# Patient Record
Sex: Female | Born: 1965
Health system: Southern US, Community
[De-identification: ages and names within clinical notes are randomized; demographics above are authoritative.]

## PROBLEM LIST (undated history)

## (undated) DIAGNOSIS — F32A Depression, unspecified: Secondary | ICD-10-CM

## (undated) DIAGNOSIS — R4589 Other symptoms and signs involving emotional state: Secondary | ICD-10-CM

## (undated) DIAGNOSIS — F329 Major depressive disorder, single episode, unspecified: Secondary | ICD-10-CM

## (undated) DIAGNOSIS — D496 Neoplasm of unspecified behavior of brain: Secondary | ICD-10-CM

## (undated) DIAGNOSIS — F419 Anxiety disorder, unspecified: Secondary | ICD-10-CM

## (undated) DIAGNOSIS — R4689 Other symptoms and signs involving appearance and behavior: Secondary | ICD-10-CM

## (undated) HISTORY — PX: OTHER SURGICAL HISTORY: SHX169

## (undated) SURGERY — Surgical Case
Anesthesia: *Unknown

---

## 2017-06-01 ENCOUNTER — Other Ambulatory Visit: Payer: Self-pay

## 2017-06-01 ENCOUNTER — Emergency Department (HOSPITAL_COMMUNITY)
Admission: EM | Admit: 2017-06-01 | Discharge: 2017-06-01 | Disposition: A | Discharge status: Home / Self Care | Payer: BLUE CROSS/BLUE SHIELD | Attending: Emergency Medicine | Admitting: Emergency Medicine

## 2017-06-01 ENCOUNTER — Emergency Department (HOSPITAL_COMMUNITY)
Admission: EM | Admit: 2017-06-01 | Discharge: 2017-06-02 | Disposition: A | Discharge status: Other Acute Inpatient Hospital | Payer: BLUE CROSS/BLUE SHIELD | Attending: Emergency Medicine | Admitting: Emergency Medicine

## 2017-06-01 ENCOUNTER — Encounter (HOSPITAL_COMMUNITY): Payer: Self-pay | Admitting: Emergency Medicine

## 2017-06-01 DIAGNOSIS — Z046 Encounter for general psychiatric examination, requested by authority: Secondary | ICD-10-CM | POA: Insufficient documentation

## 2017-06-01 DIAGNOSIS — R45851 Suicidal ideations: Secondary | ICD-10-CM | POA: Insufficient documentation

## 2017-06-01 DIAGNOSIS — F17200 Nicotine dependence, unspecified, uncomplicated: Secondary | ICD-10-CM | POA: Insufficient documentation

## 2017-06-01 DIAGNOSIS — R45850 Homicidal ideations: Secondary | ICD-10-CM | POA: Diagnosis not present

## 2017-06-01 DIAGNOSIS — F329 Major depressive disorder, single episode, unspecified: Secondary | ICD-10-CM | POA: Insufficient documentation

## 2017-06-01 DIAGNOSIS — R51 Headache: Secondary | ICD-10-CM | POA: Diagnosis not present

## 2017-06-01 DIAGNOSIS — Z5321 Procedure and treatment not carried out due to patient leaving prior to being seen by health care provider: Secondary | ICD-10-CM | POA: Insufficient documentation

## 2017-06-01 HISTORY — DX: Depression, unspecified: F32.A

## 2017-06-01 HISTORY — DX: Other symptoms and signs involving emotional state: R45.89

## 2017-06-01 HISTORY — DX: Major depressive disorder, single episode, unspecified: F32.9

## 2017-06-01 HISTORY — DX: Other symptoms and signs involving appearance and behavior: R46.89

## 2017-06-01 HISTORY — DX: Anxiety disorder, unspecified: F41.9

## 2017-06-01 LAB — RAPID URINE DRUG SCREEN, HOSP PERFORMED
Amphetamines: NOT DETECTED
Barbiturates: NOT DETECTED
Benzodiazepines: NOT DETECTED
Cocaine: NOT DETECTED
Opiates: NOT DETECTED
Tetrahydrocannabinol: POSITIVE — AB

## 2017-06-01 LAB — CBC
HCT: 38.9 % (ref 36.0–46.0)
Hemoglobin: 12.4 g/dL (ref 12.0–15.0)
MCH: 26.5 pg (ref 26.0–34.0)
MCHC: 31.9 g/dL (ref 30.0–36.0)
MCV: 83.1 fL (ref 78.0–100.0)
Platelets: 271 10*3/uL (ref 150–400)
RBC: 4.68 MIL/uL (ref 3.87–5.11)
RDW: 14.6 % (ref 11.5–15.5)
WBC: 6.8 10*3/uL (ref 4.0–10.5)

## 2017-06-01 LAB — COMPREHENSIVE METABOLIC PANEL
ALT: 16 U/L (ref 14–54)
AST: 18 U/L (ref 15–41)
Albumin: 3.6 g/dL (ref 3.5–5.0)
Alkaline Phosphatase: 73 U/L (ref 38–126)
Anion gap: 9 (ref 5–15)
BUN: 14 mg/dL (ref 6–20)
CO2: 27 mmol/L (ref 22–32)
Calcium: 9.9 mg/dL (ref 8.9–10.3)
Chloride: 105 mmol/L (ref 101–111)
Creatinine, Ser: 0.94 mg/dL (ref 0.44–1.00)
GFR calc Af Amer: >60 mL/min (ref >60)
GFR calc non Af Amer: >60 mL/min (ref >60)
Glucose, Bld: 89 mg/dL (ref 65–99)
Potassium: 4.2 mmol/L (ref 3.5–5.1)
Sodium: 141 mmol/L (ref 135–145)
Total Bilirubin: 0.5 mg/dL (ref 0.3–1.2)
Total Protein: 6.4 g/dL — ABNORMAL LOW (ref 6.5–8.1)

## 2017-06-01 LAB — SALICYLATE LEVEL: Salicylate Lvl: <7 mg/dL (ref 2.8–30.0)

## 2017-06-01 LAB — ACETAMINOPHEN LEVEL: Acetaminophen (Tylenol), Serum: <10 ug/mL — ABNORMAL LOW (ref 10–30)

## 2017-06-01 LAB — ETHANOL: Alcohol, Ethyl (B): <10 mg/dL (ref <10)

## 2017-06-01 NOTE — ED Provider Notes (Signed)
[facility] EMERGENCY DEPARTMENT Provider Note   CSN: [id number] Arrival date & time: 06/01/17  2219     History   Chief Complaint Chief Complaint  Patient presents with  . Suicidal  . Homicidal    HPI Carolyn Freeman is a 52 y.o. female.  Patient with hx of anxiety, depression, and suicidal behavior presents to the ED with depression.  She states that she has also been feeling like this since Thanksgiving, but she reports that her symptoms have been gradually worsening.  She reports that she has had thought of committing suicide, but has not had a specific plan.  She states that she uses marijuana for chronic headaches.  She states that her last ETOH use was at Christmas.  She states that she has a headache, but that his is her typical patient.  She denies numbness, weakness, tingling, fever, or chills. She denies any other associated symptoms.     The history is provided by the patient. No language interpreter was used.    Past Medical History:  Diagnosis Date  . Anxiety   . Depression   . Suicidal behavior     There are no active problems to display for this patient.   Past Surgical History:  Procedure Laterality Date  . annuresym      OB History    No data available       Home Medications    Prior to Admission medications   Not on File    Family History No family history on file.  Social History Social History   Tobacco Use  . Smoking status: Current Every Day Smoker  . Smokeless tobacco: Current User  Substance Use Topics  . Alcohol use: Yes  . Drug use: Yes    Types: Marijuana     Allergies   Patient has no known allergies.   Review of Systems Review of Systems  All other systems reviewed and are negative.    Physical Exam Updated Vital Signs BP (!) 180/114 (BP Location: Right Arm)   Pulse 60   Temp 98.7 F (37.1 C) (Oral)   Resp 16   Ht 5\' 3"  (1.6 m)   Wt 68 kg (150 lb)   SpO2 100%   BMI 26.57 kg/m    Physical Exam  Constitutional: She is oriented to person, place, and time. She appears well-developed and well-nourished.  HENT:  Head: Normocephalic and atraumatic.  Eyes: Conjunctivae and EOM are normal. Pupils are equal, round, and reactive to light.  Neck: Normal range of motion. Neck supple.  Cardiovascular: Normal rate and regular rhythm. Exam reveals no gallop and no friction rub.  No murmur heard. Pulmonary/Chest: Effort normal and breath sounds normal. No respiratory distress. She has no wheezes. She has no rales. She exhibits no tenderness.  Abdominal: Soft. Bowel sounds are normal. She exhibits no distension and no mass. There is no tenderness. There is no rebound and no guarding.  Musculoskeletal: Normal range of motion. She exhibits no edema or tenderness.  Neurological: She is alert and oriented to person, place, and time.  Skin: Skin is warm and dry.  Psychiatric: She has a normal mood and affect. Her behavior is normal. Judgment and thought content normal.  Nursing note and vitals reviewed.    ED Treatments / Results  Labs (all labs ordered are listed, but only abnormal results are displayed) Labs Reviewed - No data to display  EKG  EKG Interpretation None       Radiology  No results found.  Procedures Procedures (including critical care time)  Medications Ordered in ED Medications - No data to display   Initial Impression / Assessment and Plan / ED Course  I have reviewed the triage vital signs and the nursing notes.  Pertinent labs & imaging results that were available during my care of the patient were reviewed by me and considered in my medical decision making (see chart for details).     Patient with depression and suicidal thoughts.  Eloped from the hospital earlier, but was brought back by [employer].    Medically clear for TTS evaluation.  TTS recommends inpatient treatment.    Final Clinical Impressions(s) / ED Diagnoses   Final diagnoses:   Suicidal ideation    ED Discharge Orders    None       Montine Circle, PA-C 06/02/17 [id number]    Varney Biles, MD 06/02/17 1154

## 2017-06-01 NOTE — ED Triage Notes (Signed)
Pt. Stated, My therapist suggest I come here. Im suicidal, homicidal, and I have depression.  This has been bad for over a month. Pt. Also hears voices.

## 2017-06-01 NOTE — ED Triage Notes (Signed)
Pt brought in by [employer] for IVC after she eloped from the hospital.

## 2017-06-01 NOTE — ED Notes (Signed)
Dr. Laverta Baltimore notified of patient elopement and statements during triage. IVC paperwork initiated.
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
## 2017-06-01 NOTE — ED Notes (Signed)
Pt observed leaving dept in street clothes

## 2017-06-02 ENCOUNTER — Other Ambulatory Visit: Payer: Self-pay

## 2017-06-02 ENCOUNTER — Encounter (HOSPITAL_COMMUNITY): Payer: Self-pay

## 2017-06-02 ENCOUNTER — Inpatient Hospital Stay (HOSPITAL_COMMUNITY)
Admission: AD | Admit: 2017-06-02 | Discharge: 2017-06-04 | DRG: 885 | Disposition: A | Discharge status: Home / Self Care | Payer: BLUE CROSS/BLUE SHIELD | Source: Intra-hospital | Attending: Psychiatry | Admitting: Psychiatry

## 2017-06-02 DIAGNOSIS — Z8249 Family history of ischemic heart disease and other diseases of the circulatory system: Secondary | ICD-10-CM

## 2017-06-02 DIAGNOSIS — I10 Essential (primary) hypertension: Secondary | ICD-10-CM | POA: Diagnosis present

## 2017-06-02 DIAGNOSIS — R51 Headache: Secondary | ICD-10-CM | POA: Diagnosis not present

## 2017-06-02 DIAGNOSIS — Z87891 Personal history of nicotine dependence: Secondary | ICD-10-CM

## 2017-06-02 DIAGNOSIS — F39 Unspecified mood [affective] disorder: Secondary | ICD-10-CM | POA: Diagnosis not present

## 2017-06-02 DIAGNOSIS — Z560 Unemployment, unspecified: Secondary | ICD-10-CM | POA: Diagnosis not present

## 2017-06-02 DIAGNOSIS — G4700 Insomnia, unspecified: Secondary | ICD-10-CM | POA: Diagnosis present

## 2017-06-02 DIAGNOSIS — F6381 Intermittent explosive disorder: Secondary | ICD-10-CM

## 2017-06-02 DIAGNOSIS — F419 Anxiety disorder, unspecified: Secondary | ICD-10-CM | POA: Diagnosis present

## 2017-06-02 DIAGNOSIS — F1210 Cannabis abuse, uncomplicated: Secondary | ICD-10-CM | POA: Diagnosis present

## 2017-06-02 DIAGNOSIS — F329 Major depressive disorder, single episode, unspecified: Secondary | ICD-10-CM | POA: Diagnosis present

## 2017-06-02 DIAGNOSIS — F1290 Cannabis use, unspecified, uncomplicated: Secondary | ICD-10-CM | POA: Diagnosis not present

## 2017-06-02 DIAGNOSIS — F331 Major depressive disorder, recurrent, moderate: Secondary | ICD-10-CM

## 2017-06-02 DIAGNOSIS — F333 Major depressive disorder, recurrent, severe with psychotic symptoms: Secondary | ICD-10-CM | POA: Diagnosis present

## 2017-06-02 DIAGNOSIS — F332 Major depressive disorder, recurrent severe without psychotic features: Secondary | ICD-10-CM | POA: Diagnosis not present

## 2017-06-02 DIAGNOSIS — Z79899 Other long term (current) drug therapy: Secondary | ICD-10-CM

## 2017-06-02 LAB — PREGNANCY, URINE: Preg Test, Ur: NEGATIVE

## 2017-06-02 MED ORDER — LISINOPRIL 10 MG PO TABS
10.0000 mg | ORAL_TABLET | Freq: Once | ORAL | Status: AC
  Administered 2017-06-02: 10 mg via ORAL
  Filled 2017-06-02: qty 1

## 2017-06-02 MED ORDER — LITHIUM CARBONATE 300 MG PO CAPS
300.0000 mg | ORAL_CAPSULE | Freq: Two times a day (BID) | ORAL | Status: DC
  Administered 2017-06-02 – 2017-06-04 (×4): 300 mg via ORAL
  Filled 2017-06-02 (×8): qty 1

## 2017-06-02 MED ORDER — LITHIUM CARBONATE 300 MG PO CAPS
300.0000 mg | ORAL_CAPSULE | Freq: Two times a day (BID) | ORAL | Status: DC
  Administered 2017-06-02: 300 mg via ORAL
  Filled 2017-06-02: qty 1

## 2017-06-02 MED ORDER — LORAZEPAM 0.5 MG PO TABS: 0.5000 mg | ORAL_TABLET | Freq: Four times a day (QID) | ORAL | Status: DC | PRN

## 2017-06-02 MED ORDER — LISINOPRIL 10 MG PO TABS
10.0000 mg | ORAL_TABLET | Freq: Every day | ORAL | Status: DC
  Filled 2017-06-02: qty 1

## 2017-06-02 MED ORDER — LISINOPRIL 10 MG PO TABS
10.0000 mg | ORAL_TABLET | Freq: Every day | ORAL | Status: DC
Start: 2017-06-02 — End: 2017-06-02
  Administered 2017-06-02: 10 mg via ORAL
  Filled 2017-06-02: qty 1

## 2017-06-02 MED ORDER — MIRTAZAPINE 15 MG PO TABS
7.5000 mg | ORAL_TABLET | Freq: Two times a day (BID) | ORAL | Status: DC
  Administered 2017-06-02: 7.5 mg via ORAL
  Filled 2017-06-02: qty 1

## 2017-06-02 MED ORDER — AMLODIPINE BESYLATE 2.5 MG PO TABS
2.5000 mg | ORAL_TABLET | Freq: Every day | ORAL | Status: DC
  Administered 2017-06-03 – 2017-06-04 (×2): 2.5 mg via ORAL
  Filled 2017-06-02 (×4): qty 1

## 2017-06-02 MED ORDER — MIRTAZAPINE 7.5 MG PO TABS: 7.5000 mg | ORAL_TABLET | Freq: Every day | ORAL | Status: DC

## 2017-06-02 MED ORDER — OLANZAPINE 7.5 MG PO TABS
15.0000 mg | ORAL_TABLET | Freq: Every day | ORAL | Status: DC
  Filled 2017-06-02: qty 2

## 2017-06-02 MED ORDER — OLANZAPINE 5 MG PO TABS
5.0000 mg | ORAL_TABLET | Freq: Every day | ORAL | Status: DC
  Administered 2017-06-02 – 2017-06-03 (×2): 5 mg via ORAL
  Filled 2017-06-02 (×5): qty 1

## 2017-06-02 MED ORDER — MAGNESIUM HYDROXIDE 400 MG/5ML PO SUSP: 30.0000 mL | Freq: Every day | ORAL | Status: DC | PRN

## 2017-06-02 MED ORDER — ALUM & MAG HYDROXIDE-SIMETH 200-200-20 MG/5ML PO SUSP: 30.0000 mL | ORAL | Status: DC | PRN

## 2017-06-02 MED ORDER — HYDROXYZINE HCL 25 MG PO TABS
25.0000 mg | ORAL_TABLET | Freq: Four times a day (QID) | ORAL | Status: DC | PRN
Start: 2017-06-02 — End: 2017-06-02
  Administered 2017-06-02: 25 mg via ORAL
  Filled 2017-06-02: qty 1

## 2017-06-02 MED ORDER — ACETAMINOPHEN 325 MG PO TABS
650.0000 mg | ORAL_TABLET | Freq: Four times a day (QID) | ORAL | Status: DC | PRN
  Administered 2017-06-03: 650 mg via ORAL
  Filled 2017-06-02: qty 2

## 2017-06-02 MED ORDER — LISINOPRIL 20 MG PO TABS
20.0000 mg | ORAL_TABLET | Freq: Every day | ORAL | Status: DC
  Administered 2017-06-03 – 2017-06-04 (×2): 20 mg via ORAL
  Filled 2017-06-02 (×4): qty 1

## 2017-06-02 NOTE — ED Notes (Addendum)
Pt has been accepted to [facility] 402-1. Dr. Parke Poisson accepting MD. Can arrive at 1300. Can call report to [phone]. Dr. Ashok Cordia made aware. Pt made aware of her placement at [facility], agreeable.

## 2017-06-02 NOTE — BHH Suicide Risk Assessment (Signed)
[facility] Admission Suicide Risk Assessment   Nursing information obtained from:  Patient Demographic factors:  Living alone, Unemployed Current Mental Status:  NA Loss Factors:  Decline in physical health, Financial problems / change in socioeconomic status Historical Factors:  Prior suicide attempts Risk Reduction Factors:  NA  Total Time spent with patient: 45 minutes Principal Problem:  MDD , Cannabis Dependence  Diagnosis:   Patient Active Problem List   Diagnosis Date Noted  . MDD (major depressive disorder), recurrent, severe, with psychosis ([place]) [F33.3] 06/02/2017    Continued Clinical Symptoms:  Alcohol Use Disorder Identification Test Final Score (AUDIT): 2 The "Alcohol Use Disorders Identification Test", Guidelines for Use in Primary Care, Second Edition.  World Pharmacologist [facility]). Score between 0-7:  no or low risk or alcohol related problems. Score between 8-15:  moderate risk of alcohol related problems. Score between 16-19:  high risk of alcohol related problems. Score 20 or above:  warrants further diagnostic evaluation for alcohol dependence and treatment.   CLINICAL FACTORS:  52 year old female, reports feeling depressed and overwhelmed after getting letter threatening eviction recently . Reports long history of anxiety and some depression which she attributes to history of brain aneurysm. Reports history of intermittent explosiveness which has improved overtime . Reports history of cannabis dependence. Currently denies SI, HI, and presents future oriented .   Psychiatric Specialty Exam: Physical Exam  ROS  Blood pressure (!) 153/103, pulse (!) 58, temperature 98.5 F (36.9 C), temperature source Oral, resp. rate 18, height 5\' 4"  (1.626 m), weight 68.5 kg (151 lb).Body mass index is 25.92 kg/m.   see admit note MSE   COGNITIVE FEATURES THAT CONTRIBUTE TO RISK:  Decreased level of functioning   SUICIDE RISK:   Mild:  Suicidal ideation of limited  frequency, intensity, duration, and specificity.  There are no identifiable plans, no associated intent, mild dysphoria and related symptoms, good self-control (both objective and subjective assessment), few other risk factors, and identifiable protective factors, including available and accessible social support.  Patient will be admitted to inpatient psychiatric unit for stabilization and safety. Will provide and encourage milieu participation. Provide medication management and maked adjustments as needed.  Will follow daily.    I certify that inpatient services furnished can reasonably be expected to improve the patient's condition.   Jenne Campus, MD 06/02/2017, 5:51 PM
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
## 2017-06-02 NOTE — Tx Team (Signed)
Initial Treatment Plan 06/02/2017 2:48 PM Carolyn Freeman Cancer DXA:[id number]    PATIENT STRESSORS: Financial difficulties Health problems Substance abuse   PATIENT STRENGTHS: Capable of independent living General fund of knowledge Motivation for treatment/growth   PATIENT IDENTIFIED PROBLEMS: Depression  Suicidal/homicidal ideation    "I need help with anger management"  "To find some motivation"             DISCHARGE CRITERIA:  Improved stabilization in mood, thinking, and/or behavior Verbal commitment to aftercare and medication compliance  PRELIMINARY DISCHARGE PLAN: Outpatient therapy Medication management  PATIENT/FAMILY INVOLVEMENT: This treatment plan has been presented to and reviewed with the patient, Carolyn Freeman.  The patient and family have been given the opportunity to ask questions and make suggestions.  Windell Moment, RN 06/02/2017, 2:48 PM

## 2017-06-02 NOTE — ED Notes (Signed)
Belongings given to [employer] for transport, pt left cooperatively.

## 2017-06-02 NOTE — ED Notes (Signed)
TTS completed. 

## 2017-06-02 NOTE — Progress Notes (Signed)
Carolyn Freeman is a 52 year old female being admitted involutnarily to [facility]-1 from [facility].  She came in for suicidal/homicidal thoughts but tried to leave the ED and she was IVC'd by EDP.  During [facility] admission, she was very agitated, angry and irritable.  She doesn't believe that she needs to be here and she is angry because no one told her that she was coming here.  "I thought I get [place] home."  She was able to be deescalated and was able to finish the admission process.  She currently denies SI/HI or A/V hallucinations. Oriented her to the unit.  Admission paperwork completed and signed.  Belongings searched and secured in locker # 34, no contraband found.  Skin assessment completed and no skin issues noted.  Q 15 minute checks initiated for safety.  We will continue to monitor the progress towards her goals.

## 2017-06-02 NOTE — ED Notes (Signed)
TTS asking for her IVC to be faxed to [phone]. This was done, with confirmation printed from fax machine.

## 2017-06-02 NOTE — Progress Notes (Signed)
Pt accepted to [facility] Bed 402-1 Shuvon Rankin, NP, is the accepting provider.  Dr. Parke Poisson is the attending provider.  Call report to [phone]  Judson Roch @[facility]  ED Notified Pt is IVC Pt may be transported by Law enforcement Pt scheduled  to arrive at [facility] @ 1 PM  Romie Minus T. Judi Cong, MSW, [place] Disposition Clinical Social Work [phone] (cell) [phone] (office)

## 2017-06-02 NOTE — Progress Notes (Signed)
Recheck of BP was 158/103.  Staffed with [employer], vistaril ordered to help with anxiety.  Will recheck BP at 5pm.

## 2017-06-02 NOTE — BHH Counselor (Signed)
Reassessment Note Pt stated she was feeling better today. Pt denies current SI, HI & auditory hallucinations. Pt's affect was flat, with slow responses & she was irritable at times. Pt stated she wanted to go home. When asked about her presentation yesterday, Pt stated "yesterday was yesterday". She denied that she endorsed SI yesterday or that she was hearing voices. Pt stated she came to hospital bc psychiatrist wanted her to come. Inpatient hospitalization continues to be recommended. Superior reviewing.

## 2017-06-02 NOTE — ED Notes (Addendum)
Belongings inventoried and in locker #4, valuables with security, pt given copy of medical clearance form and pt in purple scrubs.
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
## 2017-06-02 NOTE — ED Notes (Signed)
[facility] made aware need transportation for patient to [facility]. Can arrive at [facility] at 1300.

## 2017-06-02 NOTE — ED Notes (Signed)
TTS at bedside for reassessment. Pt is awake, drinking coffee. Doesn't like what came on her breakfast. Offered crackers, refused.

## 2017-06-02 NOTE — ED Notes (Signed)
Pt reports she takes lisinopril 25 mg. Pharmacy tech, will call [facility] to verify. Will let MD know so we can order the correct dose.

## 2017-06-03 DIAGNOSIS — F39 Unspecified mood [affective] disorder: Secondary | ICD-10-CM

## 2017-06-03 DIAGNOSIS — F332 Major depressive disorder, recurrent severe without psychotic features: Secondary | ICD-10-CM

## 2017-06-03 DIAGNOSIS — Z87891 Personal history of nicotine dependence: Secondary | ICD-10-CM

## 2017-06-03 LAB — LIPID PANEL
CHOL/HDL RATIO: 2.5 ratio
CHOLESTEROL: 213 mg/dL — AB (ref 0–200)
HDL: 84 mg/dL (ref >40)
LDL CALC: 113 mg/dL — AB (ref 0–99)
Triglycerides: 78 mg/dL (ref <150)
VLDL: 16 mg/dL (ref 0–40)

## 2017-06-03 LAB — HEMOGLOBIN A1C
Hgb A1c MFr Bld: 5.9 % — ABNORMAL HIGH (ref 4.8–5.6)
MEAN PLASMA GLUCOSE: 122.63 mg/dL

## 2017-06-03 LAB — LITHIUM LEVEL: Lithium Lvl: 0.66 mmol/L (ref 0.60–1.20)

## 2017-06-03 LAB — TSH: TSH: 1.927 u[IU]/mL (ref 0.350–4.500)

## 2017-06-03 MED ORDER — TRAZODONE HCL 50 MG PO TABS
50.0000 mg | ORAL_TABLET | Freq: Every evening | ORAL | Status: DC | PRN
  Administered 2017-06-03: 50 mg via ORAL

## 2017-06-03 NOTE — Progress Notes (Signed)
[facility] MD Progress Note  06/03/2017 1:49 PM Carolyn Freeman  MRN:  [id number]   Subjective:  Patient reports thats he is feeling better today. She states that even though she did not want to be here she is still taking advantage of the resources. She denies any SI/HI/AVha nd contracts for safety. She reports that she will be going to live with her mom and her husband is going to get her belongings out of the apartment she can't afford anymore and just let the apartment go.  Objective: Patient's chart and findings reviewed and discussed with treatment team. Patient presents in the day room interacting appropriately. She is pleasant and cooperative. Will consider discharge tomorrow and will ask CSW to gain collateral information from mother and husband.   Principal Problem: MDD (major depressive disorder), recurrent, severe, with psychosis ([place]) Diagnosis:   Patient Active Problem List   Diagnosis Date Noted  . MDD (major depressive disorder), recurrent, severe, with psychosis ([place]) [F33.3] 06/02/2017   Total Time spent with patient: 15 minutes  Past Psychiatric History: See H&P  Past Medical History:  Past Medical History:  Diagnosis Date  . Anxiety   . Depression   . Suicidal behavior     Past Surgical History:  Procedure Laterality Date  . annuresym     Family History: History reviewed. No pertinent family history. Family Psychiatric  History: See H&P Social History:  Social History   Substance and Sexual Activity  Alcohol Use Yes     Social History   Substance and Sexual Activity  Drug Use Yes  . Types: Marijuana    Social History   Socioeconomic History  . Marital status: Legally Separated    Spouse name: None  . Number of children: None  . Years of education: None  . Highest education level: None  Social Needs  . Financial resource strain: None  . Food insecurity - worry: None  . Food insecurity - inability: None  . Transportation needs - medical: None  .  Transportation needs - non-medical: None  Occupational History  . None  Tobacco Use  . Smoking status: Former Research scientist (life sciences)  . Smokeless tobacco: Current User  Substance and Sexual Activity  . Alcohol use: Yes  . Drug use: Yes    Types: Marijuana  . Sexual activity: None  Other Topics Concern  . None  Social History Narrative  . None   Additional Social History:                         Sleep: Good  Appetite:  Good  Current Medications: Current Facility-Administered Medications  Medication Dose Route Frequency Provider Last Rate Last Dose  . acetaminophen (TYLENOL) tablet 650 mg  650 mg Oral Q6H PRN Rankin, Shuvon B, NP      . alum & mag hydroxide-simeth (MAALOX/MYLANTA) 200-200-20 MG/5ML suspension 30 mL  30 mL Oral Q4H PRN Rankin, Shuvon B, NP      . amLODipine (NORVASC) tablet 2.5 mg  2.5 mg Oral Daily Cobos, Myer Peer, MD   2.5 mg at 06/03/17 0950  . lisinopril (PRINIVIL,ZESTRIL) tablet 20 mg  20 mg Oral Daily Cobos, Myer Peer, MD   20 mg at 06/03/17 0949  . lithium carbonate capsule 300 mg  300 mg Oral BID WC Rankin, Shuvon B, NP   300 mg at 06/03/17 0950  . LORazepam (ATIVAN) tablet 0.5 mg  0.5 mg Oral Q6H PRN Cobos, Myer Peer, MD      .  magnesium hydroxide (MILK OF MAGNESIA) suspension 30 mL  30 mL Oral Daily PRN Rankin, Shuvon B, NP      . OLANZapine (ZYPREXA) tablet 5 mg  5 mg Oral QHS Cobos, Myer Peer, MD   5 mg at 06/02/17 2213    Lab Results:  Results for orders placed or performed during the hospital encounter of 06/02/17 (from the past 48 hour(s))  Lithium level     Status: None   Collection Time: 06/03/17  6:29 AM  Result Value Ref Range   Lithium Lvl 0.66 0.60 - 1.20 mmol/L    Comment: Performed at [facility], [place] [address] [place], [place] [address]  Lipid panel     Status: Abnormal   Collection Time: 06/03/17  6:29 AM  Result Value Ref Range   Cholesterol 213 (H) 0 - 200 mg/dL   Triglycerides 78 <150 mg/dL   HDL 84 >40  mg/dL   Total CHOL/HDL Ratio 2.5 RATIO   VLDL 16 0 - 40 mg/dL   LDL Cholesterol 113 (H) 0 - 99 mg/dL    Comment:        Total Cholesterol/HDL:CHD Risk Coronary Heart Disease Risk Table                     Men   Women  1/2 Average Risk   3.4   3.3  Average Risk       5.0   4.4  2 X Average Risk   9.6   7.1  3 X Average Risk  23.4   11.0        Use the calculated Patient Ratio above and the CHD Risk Table to determine the patient's CHD Risk.        ATP III CLASSIFICATION (LDL):  <100     mg/dL   Optimal  100-129  mg/dL   Near or Above                    Optimal  130-159  mg/dL   Borderline  160-189  mg/dL   High  >190     mg/dL   Very High Performed at [facility] [address] [place], [place] [address]   Hemoglobin A1c     Status: Abnormal   Collection Time: 06/03/17  6:29 AM  Result Value Ref Range   Hgb A1c MFr Bld 5.9 (H) 4.8 - 5.6 %    Comment: (NOTE) Pre diabetes:          5.7%-6.4% Diabetes:              >6.4% Glycemic control for   <7.0% adults with diabetes    Mean Plasma Glucose 122.63 mg/dL    Comment: Performed at [facility] [address] [place], [place] [address]  TSH     Status: None   Collection Time: 06/03/17  6:29 AM  Result Value Ref Range   TSH 1.927 0.350 - 4.500 uIU/mL    Comment: Performed by a 3rd Generation assay with a functional sensitivity of <=0.01 uIU/mL. Performed at [facility], [place] [address], [place], [place] [address]     Blood Alcohol level:  Lab Results  Component Value Date   ETH <10 33/82/5053    Metabolic Disorder Labs: Lab Results  Component Value Date   HGBA1C 5.9 (H) 06/03/2017   MPG 122.63 06/03/2017   No results found for: PROLACTIN Lab Results  Component Value Date  CHOL 213 (H) 06/03/2017   TRIG 78 06/03/2017   HDL 84 06/03/2017   CHOLHDL 2.5 06/03/2017   VLDL 16 06/03/2017   LDLCALC 113 (H) 06/03/2017    Physical Findings: AIMS: Facial and Oral  Movements Muscles of Facial Expression: None, normal Lips and Perioral Area: None, normal Jaw: None, normal Tongue: None, normal,Extremity Movements Upper (arms, wrists, hands, fingers): None, normal Lower (legs, knees, ankles, toes): None, normal, Trunk Movements Neck, shoulders, hips: None, normal, Overall Severity Severity of abnormal movements (highest score from questions above): None, normal Incapacitation due to abnormal movements: None, normal Patient's awareness of abnormal movements (rate only patient's report): No Awareness, Dental Status Current problems with teeth and/or dentures?: No Does patient usually wear dentures?: No  CIWA:    COWS:     Musculoskeletal: Strength & Muscle Tone: within normal limits Gait & Station: normal Patient leans: N/A  Psychiatric Specialty Exam: Physical Exam  Nursing note and vitals reviewed. Constitutional: She is oriented to person, place, and time. She appears well-developed and well-nourished.  Cardiovascular: Normal rate.  Respiratory: Effort normal.  Musculoskeletal: Normal range of motion.  Neurological: She is alert and oriented to person, place, and time.  Skin: Skin is warm.    Review of Systems  Constitutional: Negative.   HENT: Negative.   Eyes: Negative.   Respiratory: Negative.   Cardiovascular: Negative.   Gastrointestinal: Negative.   Genitourinary: Negative.   Musculoskeletal: Negative.   Skin: Negative.   Neurological: Negative.   Endo/Heme/Allergies: Negative.   Psychiatric/Behavioral: Negative.     Blood pressure (!) 140/95, pulse 93, temperature 99.3 F (37.4 C), temperature source Oral, resp. rate 12, height 5\' 4"  (1.626 m), weight 68.5 kg (151 lb).Body mass index is 25.92 kg/m.  General Appearance: Casual  Eye Contact:  Good  Speech:  Clear and Coherent and Normal Rate  Volume:  Normal  Mood:  Euthymic  Affect:  Appropriate  Thought Process:  Goal Directed and Descriptions of Associations: Intact   Orientation:  Full (Time, Place, and Person)  Thought Content:  WDL  Suicidal Thoughts:  No  Homicidal Thoughts:  No  Memory:  Immediate;   Good Recent;   Good Remote;   Good  Judgement:  Good  Insight:  Good  Psychomotor Activity:  Normal  Concentration:  Concentration: Good and Attention Span: Good  Recall:  Good  Fund of Knowledge:  Good  Language:  Good  Akathisia:  No  Handed:  Right  AIMS (if indicated):     Assets:  Communication Skills Desire for Improvement Financial Resources/Insurance Housing Physical Health Social Support Transportation  ADL's:  Intact  Cognition:  WNL  Sleep:  Number of Hours: 4.25   Problems Addressed: MDD severe  Treatment Plan Summary: Daily contact with patient to assess and evaluate symptoms and progress in treatment, Medication management and Plan is to:  -Continue Lithium Carbonate 300 mg PO BIDWC for mood stability -Continue Zyprexa 5 mg PO QHS for mood stability -Continue Ativan 0.5 mg PO Q6H PRN for anxiety -Encourage group therapy participation -Possible discharge tomorrow.   [place], FNP 06/03/2017, 1:49 PM   Agree with NP Progress Note

## 2017-06-03 NOTE — BHH Group Notes (Signed)
LCSW Group Therapy Note 06/03/2017 4:01 PM  Type of Therapy and Topic: Group Therapy: Feelings around Relapse and Recovery  Participation Level: Active   Description of Group:  Patients in this group will discuss emotions they experience before and after a relapse. They will process how experiencing these feelings, or avoidance of experiencing them, relates to having a relapse. Facilitator will guide patients to explore emotions they have related to recovery. Patients will be encouraged to process which emotions are more powerful. They will be guided to discuss the emotional reaction significant others in their lives may have to their relapse or recovery. Patients will be assisted in exploring ways to respond to the emotions of others without this contributing to a relapse.  Therapeutic Goals: 1. Patient will identify two or more emotions that lead to a relapse for them 2. Patient will identify two emotions that result when they relapse 3. Patient will identify two emotions related to recovery 4. Patient will demonstrate ability to communicate their needs through discussion and/or role plays  Summary of Patient Progress:   Hollyann was engaged during the entire group session. She participated and contributed to the group's discussion about relapse.    Therapeutic Modalities:  Cognitive Behavioral Therapy Solution-Focused Therapy Assertiveness Training Relapse Prevention Therapy   Theresa Duty Clinical Social Worker

## 2017-06-03 NOTE — Progress Notes (Addendum)
  [facility] Adult Case Management Discharge Plan :  Will you be returning to the same living situation after discharge:  Yes,  home At discharge, do you have transportation home?: Yes,  husband or family member--pt scheduled for discharge on Saturday, 06/04/17.  Do you have the ability to pay for your medications: Yes,  BC[employer] insurance  Release of information consent forms completed and submitted to medical records by CSW.   Patient to Follow up at: Follow-up Information    Group, [facility] Follow up on 06/06/2017.   Specialty:  Behavioral Health Why:  Hospital follow-up on 1/228 at 3:00PM. If you are interested in therapy, please tell your provider at this appt. Thank you.  Contact information: [place] [address] [phone]           Next level of care provider has access to [place] and Suicide Prevention discussed: Yes,  SPE completed with pt's husband. SPI Pamphlet and Mobile Crisis information provided to pt.   Have you used any form of tobacco in the last 30 days? (Cigarettes, Smokeless Tobacco, Cigars, and/or Pipes): No  Has patient been referred to the Quitline?: N/A patient is not a smoker  Patient has been referred for addiction treatment: Yes  [employer], LCSW 06/03/2017, 3:20 PM

## 2017-06-03 NOTE — Progress Notes (Signed)
Adult Psychoeducational Group Note  Date:  06/03/2017 Time: 3:45 pm  Group Topic/Focus:  Developing a Wellness Toolbox:   The focus of this group is to help patients develop a "wellness toolbox" with skills and strategies to promote recovery upon discharge.  Participation Level:  Active  Participation Quality:  Appropriate  Affect:  Appropriate  Cognitive:  Alert and Appropriate  Insight: Appropriate, Good and Improving  Engagement in Group:  Developing/Improving  Modes of Intervention:  Education and Problem-solving  Additional Comments: Group discussed how aroma therapy helps to improve sleep and pain management . Pt stated this would be very beneficial to her especially sleep.  Jaynie Bream 06/03/2017, 6:01 PM

## 2017-06-03 NOTE — Progress Notes (Signed)
The patient attended the evening Wrap-Up group and was appropriate.  

## 2017-06-03 NOTE — Progress Notes (Signed)
D Patient is seen [facility] UAL on the 300 hall today. She tolelates this well. A She completed her daily assessment and on this she wrote she denied SI " 2/2/5", respectively. In treatment team, she was told she will " be here for a few days", after she said " I'm jus ready to go home ...that's my goal". Patient demonstrates minimal insight into her situation in that she refuses to hear this writer's explanation fo what was said to her in treatment team, as well as describign unit functioning. SHe is adamant that she be discharged TODAY and her MD, Dr. Parke Poisson is clear that he is not ready to send her. She can get irriatble very quickly and writer pointed out to her, as she was so admamantly demanding she be sent  Home today. R Safety sin palce

## 2017-06-03 NOTE — Tx Team (Signed)
Interdisciplinary Treatment and Diagnostic Plan Update  06/03/2017 Time of Session: 10:40a Carolyn Freeman MRN: [id number]  Principal Diagnosis: <principal problem not specified>  Secondary Diagnoses: Active Problems:   MDD (major depressive disorder), recurrent, severe, with psychosis ([place])   Current Medications:  Current Facility-Administered Medications  Medication Dose Route Frequency Provider Last Rate Last Dose  . acetaminophen (TYLENOL) tablet 650 mg  650 mg Oral Q6H PRN Rankin, Shuvon B, NP      . alum & mag hydroxide-simeth (MAALOX/MYLANTA) 200-200-20 MG/5ML suspension 30 mL  30 mL Oral Q4H PRN Rankin, Shuvon B, NP      . amLODipine (NORVASC) tablet 2.5 mg  2.5 mg Oral Daily Cobos, Myer Peer, MD   2.5 mg at 06/03/17 0950  . lisinopril (PRINIVIL,ZESTRIL) tablet 20 mg  20 mg Oral Daily Cobos, Myer Peer, MD   20 mg at 06/03/17 0949  . lithium carbonate capsule 300 mg  300 mg Oral BID WC Rankin, Shuvon B, NP   300 mg at 06/03/17 0950  . LORazepam (ATIVAN) tablet 0.5 mg  0.5 mg Oral Q6H PRN Cobos, Myer Peer, MD      . magnesium hydroxide (MILK OF MAGNESIA) suspension 30 mL  30 mL Oral Daily PRN Rankin, Shuvon B, NP      . OLANZapine (ZYPREXA) tablet 5 mg  5 mg Oral QHS Cobos, Myer Peer, MD   5 mg at 06/02/17 2213   PTA Medications: Medications Prior to Admission  Medication Sig Dispense Refill Last Dose  . gabapentin (NEURONTIN) 100 MG capsule Take 100 mg by mouth 3 (three) times daily.   Past Week at Unknown time  . lisinopril (PRINIVIL,ZESTRIL) 10 MG tablet Take 10 mg by mouth daily.   Past Week at Unknown time  . mirtazapine (REMERON) 15 MG tablet Take 15 mg by mouth at bedtime.   Past Week at Unknown time  . lithium carbonate 300 MG capsule Take 300 mg by mouth 2 (two) times daily with a meal.   06/01/2017 at Unknown time  . OLANZapine (ZYPREXA) 15 MG tablet Take 15 mg by mouth at bedtime.   06/01/2017 at Unknown time    Patient Stressors: Financial difficulties Health  problems Substance abuse  Patient Strengths: Capable of independent living [employer] of knowledge Motivation for treatment/growth  Treatment Modalities: Medication Management, Group therapy, Case management,  1 to 1 session with clinician, Psychoeducation, Recreational therapy.   Physician Treatment Plan for Primary Diagnosis: <principal problem not specified> Long Term Goal(s): Improvement in symptoms so as ready for discharge Improvement in symptoms so as ready for discharge   Short Term Goals: Ability to identify changes in lifestyle to reduce recurrence of condition will improve Ability to maintain clinical measurements within normal limits will improve Ability to identify triggers associated with substance abuse/mental health issues will improve  Medication Management: Evaluate patient's response, side effects, and tolerance of medication regimen.  Therapeutic Interventions: 1 to 1 sessions, Unit Group sessions and Medication administration.  Evaluation of Outcomes: Not Met  Physician Treatment Plan for Secondary Diagnosis: Active Problems:   MDD (major depressive disorder), recurrent, severe, with psychosis ([place])  Long Term Goal(s): Improvement in symptoms so as ready for discharge Improvement in symptoms so as ready for discharge   Short Term Goals: Ability to identify changes in lifestyle to reduce recurrence of condition will improve Ability to maintain clinical measurements within normal limits will improve Ability to identify triggers associated with substance abuse/mental health issues will improve     Medication Management:  Evaluate patient's response, side effects, and tolerance of medication regimen.  Therapeutic Interventions: 1 to 1 sessions, Unit Group sessions and Medication administration.  Evaluation of Outcomes: Not Met   RN Treatment Plan for Primary Diagnosis: <principal problem not specified> Long Term Goal(s): Knowledge of disease and  therapeutic regimen to maintain health will improve  Short Term Goals: Ability to verbalize frustration and anger appropriately will improve, Ability to demonstrate self-control, Ability to participate in decision making will improve, Ability to identify and develop effective coping behaviors will improve and Compliance with prescribed medications will improve  Medication Management: RN will administer medications as ordered by provider, will assess and evaluate patient's response and provide education to patient for prescribed medication. RN will report any adverse and/or side effects to prescribing provider.  Therapeutic Interventions: 1 on 1 counseling sessions, Psychoeducation, Medication administration, Evaluate responses to treatment, Monitor vital signs and CBGs as ordered, Perform/monitor CIWA, COWS, AIMS and Fall Risk screenings as ordered, Perform wound care treatments as ordered.  Evaluation of Outcomes: Not Met   LCSW Treatment Plan for Primary Diagnosis: <principal problem not specified> Long Term Goal(s): Safe transition to appropriate next level of care at discharge, Engage patient in therapeutic group addressing interpersonal concerns.  Short Term Goals: Engage patient in aftercare planning with referrals and resources, Increase social support, Increase ability to appropriately verbalize feelings, Increase emotional regulation, Facilitate patient progression through stages of change regarding substance use diagnoses and concerns and Increase skills for wellness and recovery  Therapeutic Interventions: Assess for all discharge needs, 1 to 1 time with Social worker, Explore available resources and support systems, Assess for adequacy in community support network, Educate family and significant other(s) on suicide prevention, Complete Psychosocial Assessment, Interpersonal group therapy.  Evaluation of Outcomes: Not Met   Progress in Treatment: Attending groups: Yes. Participating  in groups: Yes. Taking medication as prescribed: Yes. Toleration medication: Yes. Family/Significant other contact made: No, will contact:  CSW will contact the patient's husband Patient understands diagnosis: Yes. Discussing patient identified problems/goals with staff: Yes. Medical problems stabilized or resolved: Yes. Denies suicidal/homicidal ideation: Yes. Issues/concerns per patient self-inventory: No. Other:   New problem(s) identified: None   New Short Term/Long Term Goal(s): medication stabilization, elimination of SI thoughts, development of comprehensive mental wellness plan.   Patient Goal: "I want to learn how to cope with my stress"   Discharge Plan or Barriers: Return home and follow up with her psychiatrist at [facility]   Reason for Continuation of Hospitalization: Aggression Anxiety Depression Suicidal ideation  Estimated Length of Stay: Monday, 06/06/17  Attendees: Patient: Carolyn Freeman  06/03/2017 12:30 PM  Physician: Dr. Neita Garnet 06/03/2017 12:30 PM  Nursing: Chong Sicilian, RN  06/03/2017 12:30 PM  RN Care Manager: 06/03/2017 12:30 PM  Social Worker: Radonna Ricker, [place] 06/03/2017 12:30 PM  Recreational Therapist:  06/03/2017 12:30 PM  Other:  06/03/2017 12:30 PM  Other:  06/03/2017 12:30 PM  Other: 06/03/2017 12:30 PM    Scribe for Treatment Team: Marylee Floras, [place] 06/03/2017 12:30 PM

## 2017-06-03 NOTE — Progress Notes (Signed)
Pt has been observed sitting in the dayroom all evening watching TV with limited interaction with the other patients.  She attended evening group and participated.  Pt denies SI/HI/AVH.  She says that she wants to discharge tomorrow, and that she spoke to the doctor today and told him that her admission was a misunderstanding.  She says that she was never suicidal, but feeling overwhelmed.  She presents to Probation officer as irritable and impersonal.  She answers assessment questions, but does not engage in conversation.  She was suspicious when talking about her meds.  Support and encouragement offered.  Pt offered reassurance.  Discharge plans are in process.  Pt encouraged to discuss her plans with the doctor. Pt signed a voluntary consent this evening.  Safety maintained with q15 minute checks.

## 2017-06-03 NOTE — Progress Notes (Signed)
Recreation Therapy Notes  Date: 06/03/17 Time: 0930 Location: 300 Hall Dayroom  Group Topic: Stress Management  Goal Area(s) Addresses:  Patient will verbalize importance of using healthy stress management.  Patient will identify positive emotions associated with healthy stress management.   Intervention: Stress Management  Activity : Progressive Muscle Relaxation.  LRT introduced the stress management technique of progressive muscle relaxation.  LRT led patients through the technique which allowed them to tense and relax each muscle group individually.  Education:  Stress Management, Discharge Planning.   Education Outcome: Acknowledges edcuation/In group clarification offered/Needs additional education  Clinical Observations/Feedback: Pt did not attend group.     Victorino Sparrow, LRT/CTRS         Ria Comment, Deandrew Hoecker A 06/03/2017 10:58 AM

## 2017-06-03 NOTE — BHH Suicide Risk Assessment (Signed)
[place] INPATIENT:  Family/Significant Other Suicide Prevention Education  Suicide Prevention Education:  Education Completed; Carolyn Freeman [phone]) has been identified by the patient as the family member/significant other with whom the patient will be residing, and identified as the person(s) who will aid the patient in the event of a mental health crisis (suicidal ideations/suicide attempt).  With written consent from the patient, the family member/significant other has been provided the following suicide prevention education, prior to the and/or following the discharge of the patient.  The suicide prevention education provided includes the following:  Suicide risk factors  Suicide prevention and interventions  National Suicide Hotline telephone number  [facility] assessment telephone number  [facility] Emergency Assistance [place] and/or Residential Mobile Crisis Unit telephone number  Request made of family/significant other to:  Remove weapons (e.g., guns, rifles, knives), all items previously/currently identified as safety concern.    Remove drugs/medications (over-the-counter, prescriptions, illicit drugs), all items previously/currently identified as a safety concern.  The family member/significant other verbalizes understanding of the suicide prevention education information provided.  The family member/significant other agrees to remove the items of safety concern listed above.   Carolyn Freeman 06/03/2017, 3:16 PM

## 2017-06-04 DIAGNOSIS — F331 Major depressive disorder, recurrent, moderate: Secondary | ICD-10-CM

## 2017-06-04 MED ORDER — AMLODIPINE BESYLATE 2.5 MG PO TABS: 2.5000 mg | ORAL_TABLET | Freq: Every day | ORAL | 0 refills | Status: DC

## 2017-06-04 MED ORDER — LITHIUM CARBONATE 300 MG PO CAPS: 300.0000 mg | ORAL_CAPSULE | Freq: Two times a day (BID) | ORAL | 0 refills | Status: DC

## 2017-06-04 MED ORDER — LISINOPRIL 20 MG PO TABS: 20.0000 mg | ORAL_TABLET | Freq: Every day | ORAL | 0 refills | Status: DC

## 2017-06-04 MED ORDER — TRAZODONE HCL 50 MG PO TABS: 50.0000 mg | ORAL_TABLET | Freq: Every evening | ORAL | 0 refills | Status: DC | PRN

## 2017-06-04 MED ORDER — OLANZAPINE 5 MG PO TABS: 5.0000 mg | ORAL_TABLET | Freq: Every day | ORAL | 0 refills | Status: DC

## 2017-06-04 NOTE — BHH Suicide Risk Assessment (Addendum)
[facility] Discharge Suicide Risk Assessment   Principal Problem: MDD (major depressive disorder), recurrent, severe, with psychosis ([place]) Discharge Diagnoses:  Patient Active Problem List   Diagnosis Date Noted  . MDD (major depressive disorder), recurrent, severe, with psychosis ([place]) [F33.3] 06/02/2017    Total Time spent with patient: 30 minutes  Musculoskeletal: Strength & Muscle Tone: within normal limits Gait & Station: normal Patient leans: N/A  Psychiatric Specialty Exam: ROS no headache at this time,but has history of headaches, no chest pain, no shortness of breath, no vomiting, no fever   Blood pressure 131/90, pulse 66, temperature 98.2 F (36.8 C), temperature source Oral, resp. rate 12, height 5\' 4"  (1.626 m), weight 68.5 kg (151 lb).Body mass index is 25.92 kg/m.  General Appearance: Well Groomed  Eye Contact::  Good  Speech:  Normal Rate409  Volume:  Normal  Mood:  presents euthymic, denies feeling depressed, states " I am feeling good "  Affect:  Appropriate and Full Range  Thought Process:  Linear and Descriptions of Associations: Intact  Orientation:  Full (Time, Place, and Person)  Thought Content:  denies hallucinations, no delusions, not internally preoccupied   Suicidal Thoughts:  No denies suicidal or self injurious ideations, denies homicidal or violent ideations   Homicidal Thoughts:  No  Memory:  recent and remote grossly intact   Judgement:  Other:  improving   Insight:  improving   Psychomotor Activity:  Normal  Concentration:  Good  Recall:  Good  Fund of Knowledge:Good  Language: Negative  Akathisia:  Negative  Handed:  Right  AIMS (if indicated):   no abnormal or involuntary movements noted or reported   Assets:  Communication Skills Desire for Improvement Resilience  Sleep:  Number of Hours: 5.75  Cognition: WNL  ADL's:  Intact   Mental Status Per Nursing Assessment::   On Admission:  NA  Demographic Factors:  52 year old female,  separated, 4 adult children, currently lives alone   Loss Factors: Financial stressors, getting eviction notice   Historical Factors: No prior psychiatric admissions, denies  history of suicide attempts , reports history of brain aneurysm  Risk Reduction Factors:   Positive coping skills or problem solving skills  Continued Clinical Symptoms:  At this time patient is alert, attentive, well related , pleasant, mood improved and presents euthymic, affect is appropriate and reactive, no thought disorder, no suicidal or self injurious ideations, no homicidal ideations,  no psychotic symptoms, future oriented . States that she spoke with her husband ( explains that they are separated but still very close ) and that he will help her pay rent so that she is not evicted- this information has helped her feel better, with a sense or relief . Behavior on unit in good control. Pleasant on approach.  Denies medication side effects- has been on lithium and zyprexa prior to admission- denies side effects, and states she feels they are helping . Reports history of depression, intermittent explosiveness, and states she has noted she has been " much calmer" since she has been on these medications. Side effects, including symptoms of li toxicity and potential for metabolic and movement disorders on Zyprexa reviewed with patient. Cognitive Features That Contribute To Risk:  No gross cognitive deficits noted upon discharge. Is alert , attentive, and oriented x 3    Suicide Risk:  Mild:  Suicidal ideation of limited frequency, intensity, duration, and specificity.  There are no identifiable plans, no associated intent, mild dysphoria and related symptoms, good self-control (  both objective and subjective assessment), few other risk factors, and identifiable protective factors, including available and accessible social support.  Follow-up Information    Group[facility] up on 06/06/2017.    Specialty:  Behavioral Health Why:  Hospital follow-up on 1/228 at 3:00PM. If you are interested in therapy, please tell your provider at this appt. Thank you.  Contact information: [place] [address] [phone]           Plan Of Care/Follow-up recommendations:  Activity:  as tolerated  Diet:  heart healthy Tests:  NA Other:  See below  Patient is requesting discharge, expressing readiness for discharge and there are no current grounds for involuntary commitment , she is leaving unit in good spirits . Plans to return home Follow up as above  Has an established PCP, Dr. Norma Fredrickson for medical issues as needed.   Jenne Campus, MD 06/04/2017, 8:33 AM

## 2017-06-04 NOTE — BHH Group Notes (Signed)
[facility] LCSW Group Therapy Note  Date/Time:    06/04/2017 10:00-11:00AM  Type of Therapy and Topic:  Group Therapy:  Healthy vs Unhealthy Coping Skills  Participation Level:  Active   Description of Group:  The focus of this group was to determine what unhealthy coping techniques typically are used by group members and what healthy coping techniques would be helpful in coping with various problems. Patients were guided in becoming aware of the differences between healthy and unhealthy coping techniques.  Patients were asked to identify 1-2 healthy coping skills they would like to learn to use more effectively, and many mentioned meditation, breathing, and relaxation.  These were explained, samples demonstrated, and resources shared for how to learn more at discharge.   At the end of group, additional ideas of healthy coping skills were shared in a fun exercise.  Therapeutic Goals 1. Patients learned that coping is what human beings do all day long to deal with various situations in their lives 2. Patients defined and discussed healthy vs unhealthy coping techniques 3. Patients identified their preferred coping techniques and identified whether these were healthy or unhealthy 4. Patients determined 1-2 healthy coping skills they would like to become more familiar with and use more often, and practiced a few meditations 5. Patients provided support and ideas to each other  Summary of Patient Progress: During group, patient expressed that she would like to employ more meditation and mindfulness, in order to learn how to walk away from situations and give herself time to think prior to reacting in extreme anger, which she stated is what she has been doing.   Therapeutic Modalities Cognitive Behavioral Therapy Motivational Interviewing   Selmer Dominion, LCSW 06/04/2017, 1:07 PM

## 2017-06-04 NOTE — Discharge Summary (Signed)
Physician Discharge Summary Note  Patient:  Carolyn Freeman is an 52 y.o., female MRN:  [id number] DOB:  08-Feb-1966 Patient phone:  [phone] (home)  Patient address:   [address] [place] [address],  Total Time spent with patient: 20 minutes  Date of Admission:  06/02/2017 Date of Discharge: 06/04/17   Reason for Admission:  Worsening depression with SI  Principal Problem: MDD (major depressive disorder), recurrent, severe, with psychosis [facility]) Discharge Diagnoses: Patient Active Problem List   Diagnosis Date Noted  . Moderate episode of recurrent major depressive disorder ([place]) [F33.1]   . MDD (major depressive disorder), recurrent, severe, with psychosis ([place]) [F33.3] 06/02/2017    Past Psychiatric History: no prior psychiatric admissions . States she has never attempted suicide- states that about 20 years ago she overdosed on medication,  but states it was not suicidal in intention but rather an attempt to address a toothache. Denies history of self cutting. Denies any clear history of mania or hypomania, does report history suggestive of intermittent explosiveness, consistent of brief episodes of explosive anger,sometimes becoming confrontational, physical. States this is " getting better over the last couple of years ". Reports she very occasionally hears what she feels is God talking to her, reassuring her, but does not endorse any clear history of psychosis .   Past Medical History:  Past Medical History:  Diagnosis Date  . Anxiety   . Depression   . Suicidal behavior     Past Surgical History:  Procedure Laterality Date  . annuresym     Family History: History reviewed. No pertinent family history. Family Psychiatric  History: denies history of mental illness in family. History of alcohol abuse in extended family   Social History:  Social History   Substance and Sexual Activity  Alcohol Use Yes     Social History   Substance and Sexual  Activity  Drug Use Yes  . Types: Marijuana    Social History   Socioeconomic History  . Marital status: Legally Separated    Spouse name: None  . Number of children: None  . Years of education: None  . Highest education level: None  Social Needs  . Financial resource strain: None  . Food insecurity - worry: None  . Food insecurity - inability: None  . Transportation needs - medical: None  . Transportation needs - non-medical: None  Occupational History  . None  Tobacco Use  . Smoking status: Former Research scientist (life sciences)  . Smokeless tobacco: Current User  Substance and Sexual Activity  . Alcohol use: Yes  . Drug use: Yes    Types: Marijuana  . Sexual activity: None  Other Topics Concern  . None  Social History Narrative  . None    Hospital Course:   06/02/17 [facility] MD Assessment: 52 year old female, states she came to ED at the recommendation of her therapist , because " I was feeling overwhelmed, depressed" following receiving an eviction notice and a notice that possessions she had in storage were removed " because I could not afford the bill" . States " I have a history of brain surgery, and since then I get overwhelmed more easily " . ED notes indicate patient made suicidal and homicidal remarks initially, left ED and was brought back to ED via [facility] . Patient states " I did not say I was suicidal or homicidal , I was just feeling kind of overwhelmed ". States " I think I was misunderstood, I do not want  to die, but I am sometimes afraid I might die from my brain aneurysm. I do not want to kill or hurt anyone, I was simply saying I would defend myself if somebody tries to attack me". Does report some neuro-vegetative symptoms of depression as below, and acknowledges feeling depressed due to stressors as above . Of note, patient states states she has a history of a brain aneurysm which has required surgical procedure x 2 ( 2016 and 2018) -States that since then she has tended to have persistent  anxiety because " I think I can die at any time from it". States " but I do not want to die or kill myself, I want to live ".  Patient remains on the [facility] unit for 2 days and stabilized with medications and therapy. Patient was continued on Lithium Carbonate 300 mg BIDWC, continued on Zyprexa tapered to 5 mg QHS, and used Trazodone 50 mg QHS PRN and Ativan 0.5 mg Q6H PRN. Patient had antidepressants discontinued. Patient showed improvement with improved mood, affect, sleep, appetite, and interaction. Patient has been seen in the day room interacting with peers and staff appropriately. She has been attending groups and participating. Patient denies any SI/HI/AVH and contracts for safety. Patient agrees to follow up at [facility]. Patient is provided with prescriptions for her medications upon discharge.   Physical Findings: AIMS: Facial and Oral Movements Muscles of Facial Expression: None, normal Lips and Perioral Area: None, normal Jaw: None, normal Tongue: None, normal,Extremity Movements Upper (arms, wrists, hands, fingers): None, normal Lower (legs, knees, ankles, toes): None, normal, Trunk Movements Neck, shoulders, hips: None, normal, Overall Severity Severity of abnormal movements (highest score from questions above): None, normal Incapacitation due to abnormal movements: None, normal Patient's awareness of abnormal movements (rate only patient's report): No Awareness, Dental Status Current problems with teeth and/or dentures?: No Does patient usually wear dentures?: No  CIWA:    COWS:     Musculoskeletal: Strength & Muscle Tone: within normal limits Gait & Station: normal Patient leans: N/A  Psychiatric Specialty Exam: Physical Exam  Nursing note and vitals reviewed. Constitutional: She is oriented to person, place, and time. She appears well-developed and well-nourished.  Cardiovascular: Normal rate.  Respiratory: Effort normal.  Musculoskeletal: Normal  range of motion.  Neurological: She is alert and oriented to person, place, and time.  Skin: Skin is warm.    Review of Systems  Constitutional: Negative.   HENT: Negative.   Eyes: Negative.   Respiratory: Negative.   Cardiovascular: Negative.   Gastrointestinal: Negative.   Genitourinary: Negative.   Musculoskeletal: Negative.   Skin: Negative.   Neurological: Negative.   Endo/Heme/Allergies: Negative.   Psychiatric/Behavioral: Negative.     Blood pressure 131/90, pulse 66, temperature 98.2 F (36.8 C), temperature source Oral, resp. rate 12, height 5\' 4"  (1.626 m), weight 68.5 kg (151 lb).Body mass index is 25.92 kg/m.  General Appearance: Casual  Eye Contact:  Good  Speech:  Clear and Coherent and Normal Rate  Volume:  Normal  Mood:  Euthymic  Affect:  Appropriate  Thought Process:  Goal Directed and Descriptions of Associations: Intact  Orientation:  Full (Time, Place, and Person)  Thought Content:  WDL  Suicidal Thoughts:  No  Homicidal Thoughts:  No  Memory:  Immediate;   Good Recent;   Good Remote;   Good  Judgement:  Good  Insight:  Good  Psychomotor Activity:  Normal  Concentration:  Concentration: Good and Attention Span: Good  Recall:  Good  Fund of Knowledge:  Good  Language:  Good  Akathisia:  No  Handed:  Right  AIMS (if indicated):     Assets:  Communication Skills Desire for Improvement Financial Resources/Insurance Housing Physical Health Social Support Transportation  ADL's:  Intact  Cognition:  WNL  Sleep:  Number of Hours: 5.75     Have you used any form of tobacco in the last 30 days? (Cigarettes, Smokeless Tobacco, Cigars, and/or Pipes): No  Has this patient used any form of tobacco in the last 30 days? (Cigarettes, Smokeless Tobacco, Cigars, and/or Pipes) Yes, No  Blood Alcohol level:  Lab Results  Component Value Date   ETH <10 78/29/5621    Metabolic Disorder Labs:  Lab Results  Component Value Date   HGBA1C 5.9 (H)  06/03/2017   MPG 122.63 06/03/2017   No results found for: PROLACTIN Lab Results  Component Value Date   CHOL 213 (H) 06/03/2017   TRIG 78 06/03/2017   HDL 84 06/03/2017   CHOLHDL 2.5 06/03/2017   VLDL 16 06/03/2017   LDLCALC 113 (H) 06/03/2017    See Psychiatric Specialty Exam and Suicide Risk Assessment completed by Attending Physician prior to discharge.  Discharge destination:  Home  Is patient on multiple antipsychotic therapies at discharge:  No   Has Patient had three or more failed trials of antipsychotic monotherapy by history:  No  Recommended Plan for Multiple Antipsychotic Therapies: NA   Allergies as of 06/04/2017   No Known Allergies     Medication List    STOP taking these medications   gabapentin 100 MG capsule Commonly known as:  NEURONTIN   mirtazapine 15 MG tablet Commonly known as:  REMERON     TAKE these medications     Indication  amLODipine 2.5 MG tablet Commonly known as:  NORVASC Take 1 tablet (2.5 mg total) by mouth daily. For high blood pressure Start taking on:  06/05/2017  Indication:  High Blood Pressure Disorder   lisinopril 20 MG tablet Commonly known as:  PRINIVIL,ZESTRIL Take 1 tablet (20 mg total) by mouth daily. For high blood pressure Start taking on:  06/05/2017 What changed:    medication strength  how much to take  additional instructions  Another medication with the same name was removed. Continue taking this medication, and follow the directions you see here.  Indication:  High Blood Pressure Disorder   lithium carbonate 300 MG capsule Take 1 capsule (300 mg total) by mouth 2 (two) times daily with a meal. For mood control What changed:  additional instructions  Indication:  mood stability   OLANZapine 5 MG tablet Commonly known as:  ZYPREXA Take 1 tablet (5 mg total) by mouth at bedtime. For mood control What changed:    medication strength  how much to take  additional instructions  Indication:  mood  stability   traZODone 50 MG tablet Commonly known as:  DESYREL Take 1 tablet (50 mg total) by mouth at bedtime as needed and may repeat dose one time if needed for sleep.  Indication:  Trouble Sleeping      Follow-up Information    [facility] Follow up on 06/06/2017.   Specialty:  Behavioral Health Why:  Hospital follow-up on 1/228 at 3:00PM. If you are interested in therapy, please tell your provider at this appt. Thank you.  Contact information: [place] [address] [phone]           Follow-up recommendations:  Continue activity as tolerated. Continue diet as recommended by your PCP. Ensure to keep all appointments with outpatient providers.  Comments:  Patient is instructed prior to discharge to: Take all medications as prescribed by his/her mental healthcare provider. Report any adverse effects and or reactions from the medicines to his/her outpatient provider promptly. Patient has been instructed & cautioned: To not engage in alcohol and or illegal drug use while on prescription medicines. In the event of worsening symptoms, patient is instructed to call the crisis hotline, 911 and or go to the nearest ED for appropriate evaluation and treatment of symptoms. To follow-up with his/her primary care provider for your other medical issues, concerns and or health care needs.    Signed: [place], FNP 06/04/2017, 9:27 AM   Patient seen, Suicide Assessment Completed.  Disposition Plan Reviewed

## 2017-06-04 NOTE — Progress Notes (Signed)
Pt has been more relaxed and in a softer mood this evening.  She has been observed in the dayroom talking with peers and laughing.  She was jovial with Probation officer during conversation.  She voices no needs or concerns and hopes to be discharged tomorrow.  She denies SI/HI/AVH.  Support and encouragement offered.  Discharge plans are in process.  Safety maintained with q15 minute checks.

## 2017-06-04 NOTE — Progress Notes (Signed)
Pt completed her daily assessment and on this she wrote she denied  SI today and she rated her depression, hopelessness and anxiety " 3/0/3", respectively. She is given her dc instructions ( SRA, AVS, SSP and transition record )  and she stated understanding and willingness to comply. All belongings in patient's locker returned to her and she was escorted to [facility] entrance.

## 2018-02-25 ENCOUNTER — Encounter: Payer: Self-pay | Admitting: Emergency Medicine

## 2018-02-25 DIAGNOSIS — F429 Obsessive-compulsive disorder, unspecified: Secondary | ICD-10-CM

## 2018-02-25 DIAGNOSIS — F411 Generalized anxiety disorder: Secondary | ICD-10-CM

## 2018-03-09 ENCOUNTER — Ambulatory Visit (INDEPENDENT_AMBULATORY_CARE_PROVIDER_SITE_OTHER): Payer: BLUE CROSS/BLUE SHIELD | Admitting: Psychiatry

## 2018-03-09 DIAGNOSIS — F411 Generalized anxiety disorder: Secondary | ICD-10-CM | POA: Diagnosis not present

## 2018-03-09 DIAGNOSIS — F429 Obsessive-compulsive disorder, unspecified: Secondary | ICD-10-CM

## 2018-03-09 DIAGNOSIS — F29 Unspecified psychosis not due to a substance or known physiological condition: Secondary | ICD-10-CM

## 2018-03-09 DIAGNOSIS — F39 Unspecified mood [affective] disorder: Secondary | ICD-10-CM | POA: Diagnosis not present

## 2018-03-09 MED ORDER — ALPRAZOLAM 1 MG PO TABS: ORAL_TABLET | ORAL | 1 refills | Status: DC

## 2018-03-09 MED ORDER — LITHIUM CARBONATE 300 MG PO CAPS: 300.0000 mg | ORAL_CAPSULE | Freq: Two times a day (BID) | ORAL | 0 refills | Status: DC

## 2018-03-09 MED ORDER — LURASIDONE HCL 60 MG PO TABS: 60.0000 mg | ORAL_TABLET | Freq: Every day | ORAL | 1 refills | Status: DC

## 2018-03-09 NOTE — Progress Notes (Addendum)
rx for ambien hand written due to problem with computer     [facility]  Patient ID: Carolyn Freeman,  MRN: [id number]  PCP: Patient, No Pcp Per  Date of Evaluation: 03/09/2018 Time spent:20 minutes  Chief Complaint:   HISTORY/CURRENT STATUS: HPI patient is a 52 year old black female last seen 11/08/2017.  Diagnoses include mood disorder with psychosis, anxiety with panic attack, OCD, eating disorder, history of suicidal attempt. At her last visit she is having both depressive and manic symptoms so increase her Latuda to 80 mg.  She also is having hallucinations and paranoia. Worse since out of meds almost 2 weeks. Visual hallucinations-worse. .No delusions. Auditory hallucinations. Do talk about patient Paranoia people folllowing her, messing with her food. Some depression-unhappy with lot in life. Sleep poor. Crying, motivation and energy down. Passive suicidal thoughts. Manic-impulsive,grandiose, talking more, no decrease sleep. Manic sx. In any mood. Cycles numerous times in day, including anger. Emotionally unstable. Mixed episodes,  Increase anxiety off meds.   Binge eats. Then throws up almost daily. Laxatives.   ocd same.  Individual Medical History/ Review of Systems: Changes? :No   Allergies: Patient has no known allergies.  Current Medications:  Current Outpatient Medications:  .  metFORMIN (GLUCOPHAGE) 500 MG tablet, Take 500 mg by mouth 2 (two) times daily with a meal. 1d/wk, 2/d, Disp: , Rfl:  .  ALPRAZolam (XANAX) 1 MG tablet, Bid, Disp: 30 tablet, Rfl: 1 .  amLODipine (NORVASC) 2.5 MG tablet, Take 1 tablet (2.5 mg total) by mouth daily. For high blood pressure (Patient not taking: Reported on 03/09/2018), Disp: 30 tablet, Rfl: 0 .  lisinopril (PRINIVIL,ZESTRIL) 20 MG tablet, Take 1 tablet (20 mg total) by mouth daily. For high blood pressure (Patient not taking: Reported on 03/09/2018), Disp: 30 tablet, Rfl: 0 .  lithium carbonate 300 MG capsule, Take 1 capsule  (300 mg total) by mouth 2 (two) times daily with a meal. For mood control, Disp: 60 capsule, Rfl: 0 .  Lurasidone HCl (LATUDA) 60 MG TABS, Take 1 tablet (60 mg total) by mouth daily., Disp: 30 tablet, Rfl: 1 .  mirtazapine (REMERON) 15 MG tablet, Take 15 mg by mouth. Twice daily, Disp: , Rfl:  .  OLANZapine (ZYPREXA) 15 MG tablet, Take 15 mg by mouth daily., Disp: , Rfl:  .  OLANZapine (ZYPREXA) 5 MG tablet, Take 1 tablet (5 mg total) by mouth at bedtime. For mood control (Patient not taking: Reported on 03/09/2018), Disp: 30 tablet, Rfl: 0 .  traZODone (DESYREL) 50 MG tablet, Take 1 tablet (50 mg total) by mouth at bedtime as needed and may repeat dose one time if needed for sleep., Disp: 30 tablet, Rfl: 0 Medication Side Effects: none  Family Medical/ Social History: Changes? no  MENTAL HEALTH EXAM:  There were no vitals taken for this visit.There is no height or weight on file to calculate BMI.  General Appearance: Casual  Eye Contact:  Good  Speech:  Normal Rate  Volume:  Normal  Mood:  Depressed  Affect:  Appropriate  Thought Process:  Linear  Orientation:  Full (Time, Place, and Person)  Thought Content: Logical   Suicidal Thoughts:  Yes.  without intent/plan  Homicidal Thoughts:  No  Memory:  normal  Judgement:  Good  Insight:  Good  Psychomotor Activity:  Normal  Concentration:  Concentration: Good  Recall:  Good  Fund of Knowledge: Good  Language: Good  Assets:  Desire for Improvement  ADL's:  Intact  Cognition: WNL  Prognosis:  Fair  As mentioned in HPI pt with auditory command hallucinations, visual hallucinations, and paranoia  DIAGNOSES:    ICD-10-CM   1. Obsessive-compulsive disorder, unspecified type F42.9   2. Episodic mood disorder ([place]) F39   3. Anxiety state F41.1   4. Psychosis, unspecified psychosis type ([place]) [place]   hx suicide attempt 20 yrs. ago  Receiving Psychotherapy: No    RECOMMENDATIONS: We will restart his [place]'s Latuda and will go  ahead and start at 60 mg a day.  Restart lithium 600 mg a day.  Xanax 1 mg twice daily is given enough for 2 weeks.  She is requesting something for sleep.  Prescribed trazodone and Remeron without benefit.  He has had success with Ambien in the past.  We will try Ambien but patient warned about unusual sleep behavior [place] controlled substance registry has no mention. Pt from [place] 3 weeks.   Comer Locket, PA-C

## 2018-04-04 ENCOUNTER — Ambulatory Visit: Payer: BLUE CROSS/BLUE SHIELD | Admitting: Psychiatry

## 2018-04-05 ENCOUNTER — Ambulatory Visit: Payer: BLUE CROSS/BLUE SHIELD | Admitting: Psychiatry

## 2018-04-05 DIAGNOSIS — F411 Generalized anxiety disorder: Secondary | ICD-10-CM

## 2018-04-05 DIAGNOSIS — F29 Unspecified psychosis not due to a substance or known physiological condition: Secondary | ICD-10-CM

## 2018-04-05 DIAGNOSIS — F5002 Anorexia nervosa, binge eating/purging type: Secondary | ICD-10-CM

## 2018-04-05 DIAGNOSIS — F39 Unspecified mood [affective] disorder: Secondary | ICD-10-CM

## 2018-04-05 DIAGNOSIS — T1491XD Suicide attempt, subsequent encounter: Secondary | ICD-10-CM

## 2018-04-05 DIAGNOSIS — F429 Obsessive-compulsive disorder, unspecified: Secondary | ICD-10-CM

## 2018-04-05 DIAGNOSIS — T1491XA Suicide attempt, initial encounter: Secondary | ICD-10-CM

## 2018-04-05 MED ORDER — ALPRAZOLAM 1 MG PO TABS: ORAL_TABLET | ORAL | 3 refills | Status: DC

## 2018-04-05 MED ORDER — LURASIDONE HCL 80 MG PO TABS: 80.0000 mg | ORAL_TABLET | Freq: Every day | ORAL | 1 refills | Status: DC

## 2018-04-05 MED ORDER — LITHIUM CARBONATE 300 MG PO CAPS: 300.0000 mg | ORAL_CAPSULE | Freq: Two times a day (BID) | ORAL | 1 refills | Status: DC

## 2018-04-05 MED ORDER — METFORMIN HCL 500 MG PO TABS: 500.0000 mg | ORAL_TABLET | Freq: Two times a day (BID) | ORAL | 1 refills | Status: DC

## 2018-04-05 NOTE — Progress Notes (Signed)
[facility]  Patient ID: Carolyn Freeman,  MRN: [id number]  PCP: Patient, No Pcp Per  Date of Evaluation: 04/05/2018 Time spent:20 minutes  Chief Complaint:   HISTORY/CURRENT STATUS: HPI patient last seen 03/09/2018.  She was having psychosis.  Also depression and manic symptoms with rapid cycling.  Had been out of her meds for 2 weeks.  Restarted  her Latuda and her lithium.  Ambien for sleep. Currently doing well. No depression. Some manic sx., pt feels sx. Are manageable. Psychosis better. Still sees shadows. No delusions. Still with paranoia. Insomnia for years.ambien without help.   Individual Medical History/ Review of Systems: Changes? :No   Allergies: Patient has no known allergies.  Current Medications:  Current Outpatient Medications:  .  ALPRAZolam (XANAX) 1 MG tablet, Bid, Disp: 30 tablet, Rfl: 1 .  lithium carbonate 300 MG capsule, Take 1 capsule (300 mg total) by mouth 2 (two) times daily with a meal. For mood control, Disp: 60 capsule, Rfl: 0 .  Lurasidone HCl (LATUDA) 60 MG TABS, Take 1 tablet (60 mg total) by mouth daily., Disp: 30 tablet, Rfl: 1 .  metFORMIN (GLUCOPHAGE) 500 MG tablet, Take 500 mg by mouth 2 (two) times daily with a meal. 1d/wk, 2/d, Disp: , Rfl:  .  amLODipine (NORVASC) 2.5 MG tablet, Take 1 tablet (2.5 mg total) by mouth daily. For high blood pressure (Patient not taking: Reported on 03/09/2018), Disp: 30 tablet, Rfl: 0 .  lisinopril (PRINIVIL,ZESTRIL) 20 MG tablet, Take 1 tablet (20 mg total) by mouth daily. For high blood pressure (Patient not taking: Reported on 03/09/2018), Disp: 30 tablet, Rfl: 0 Medication Side Effects: none  Family Medical/ Social History: Changes? no  MENTAL HEALTH EXAM:  There were no vitals taken for this visit.There is no height or weight on file to calculate BMI.  General Appearance: Casual  Eye Contact:  Good  Speech:  Clear and Coherent  Volume:  Normal  Mood:  Euthymic  Affect:  Appropriate   Thought Process:  Goal Directed still with visual hallucinations and paranoia  Orientation:  Full (Time, Place, and Person)  Thought Content: Logical   Suicidal Thoughts:  No  Homicidal Thoughts:  No  Memory:  WNL  Judgement:  Good  Insight:  Good  Psychomotor Activity:  Normal  Concentration:  Concentration: Good  Recall:  Good  Fund of Knowledge: Good  Language: Good  Assets:  Resilience  ADL's:  Intact  Cognition: WNL  Prognosis:  Good    DIAGNOSES:    ICD-10-CM   1. Episodic mood disorder (HCC) F39   2. Obsessive-compulsive disorder, unspecified type F42.9   3. Psychosis, unspecified psychosis type ([place]) F29   4. Anxiety state F41.1   5. Anorexia nervosa, binge-eating purging type F50.02   6. Suicide attempt [facility]) T14.91XA     Receiving Psychotherapy: No    RECOMMENDATIONS: Patient is doing well.  We will increase the Latuda to 80 mg a day to help with manic symptoms and psychosis.  Manic symptoms and psychosis are much better.  increase Latuda to 80 mg 1 a day. Xanax 1 mg twice a day.  Lithium 600 mg a day.  Metformin a thousand miligrams a day. Will consider counseling in the future. Consider borderline personality disorder. Consider labs at next visit as pt on lithium and latuda. Return in 6 weeks.   Comer Locket, PA-C

## 2018-05-17 ENCOUNTER — Ambulatory Visit: Payer: BLUE CROSS/BLUE SHIELD | Admitting: Psychiatry

## 2018-07-25 ENCOUNTER — Ambulatory Visit: Payer: BLUE CROSS/BLUE SHIELD | Admitting: Psychiatry

## 2018-08-02 ENCOUNTER — Other Ambulatory Visit: Payer: Self-pay | Admitting: Psychiatry

## 2018-08-03 ENCOUNTER — Ambulatory Visit: Payer: BLUE CROSS/BLUE SHIELD | Admitting: Psychiatry

## 2018-08-08 ENCOUNTER — Telehealth: Payer: Self-pay | Admitting: Physician Assistant

## 2018-08-08 ENCOUNTER — Ambulatory Visit: Payer: BLUE CROSS/BLUE SHIELD | Admitting: Physician Assistant

## 2018-08-08 ENCOUNTER — Other Ambulatory Visit: Payer: Self-pay

## 2018-08-08 NOTE — Telephone Encounter (Signed)
Called twice at # given, LM that I'd call back.  Called the Home # once and left msg to call office to r/s.

## 2018-08-15 ENCOUNTER — Other Ambulatory Visit: Payer: Self-pay | Admitting: Psychiatry

## 2018-08-16 ENCOUNTER — Other Ambulatory Visit: Payer: Self-pay | Admitting: Physician Assistant

## 2018-08-16 MED ORDER — ALPRAZOLAM 1 MG PO TABS: 1.0000 mg | ORAL_TABLET | Freq: Two times a day (BID) | ORAL | 0 refills | Status: DC | PRN

## 2018-08-16 NOTE — Telephone Encounter (Signed)
Also requesting Alprazolam 1mg  [facility] [place]

## 2018-08-16 NOTE — Telephone Encounter (Signed)
Last fill #30 03/2018 Clay's pt but you were suppose to have phone appt with.

## 2018-08-31 ENCOUNTER — Other Ambulatory Visit: Payer: Self-pay | Admitting: Physician Assistant

## 2018-09-03 NOTE — Telephone Encounter (Signed)
Should this last 30 days?

## 2018-09-14 ENCOUNTER — Ambulatory Visit: Payer: BLUE CROSS/BLUE SHIELD | Admitting: Psychiatry

## 2018-10-06 ENCOUNTER — Other Ambulatory Visit: Payer: Self-pay | Admitting: Physician Assistant

## 2018-10-09 NOTE — Telephone Encounter (Signed)
Has appt 06/05

## 2018-10-13 ENCOUNTER — Ambulatory Visit: Payer: BLUE CROSS/BLUE SHIELD | Admitting: Physician Assistant

## 2018-11-17 ENCOUNTER — Other Ambulatory Visit: Payer: Self-pay | Admitting: Physician Assistant

## 2018-11-20 NOTE — Telephone Encounter (Signed)
Has appt 07/20

## 2018-11-27 ENCOUNTER — Ambulatory Visit: Payer: BC Managed Care – PPO | Admitting: Physician Assistant

## 2018-11-27 ENCOUNTER — Encounter

## 2018-12-29 ENCOUNTER — Telehealth: Payer: Self-pay | Admitting: Physician Assistant

## 2018-12-29 ENCOUNTER — Other Ambulatory Visit: Payer: Self-pay | Admitting: Physician Assistant

## 2018-12-29 MED ORDER — ALPRAZOLAM 1 MG PO TABS: ORAL_TABLET | ORAL | 1 refills | Status: DC

## 2018-12-29 NOTE — Telephone Encounter (Signed)
Rx sent 

## 2018-12-29 NOTE — Telephone Encounter (Signed)
Pt requesting a refill on the Xanax 1mg .Fill at the [facility] scheduled for 8/31.

## 2019-01-08 ENCOUNTER — Encounter: Payer: Self-pay | Admitting: Physician Assistant

## 2019-01-08 ENCOUNTER — Other Ambulatory Visit: Payer: Self-pay

## 2019-01-08 ENCOUNTER — Ambulatory Visit (INDEPENDENT_AMBULATORY_CARE_PROVIDER_SITE_OTHER): Payer: BC Managed Care – PPO | Admitting: Physician Assistant

## 2019-01-08 DIAGNOSIS — R441 Visual hallucinations: Secondary | ICD-10-CM | POA: Diagnosis not present

## 2019-01-08 DIAGNOSIS — F411 Generalized anxiety disorder: Secondary | ICD-10-CM | POA: Diagnosis not present

## 2019-01-08 DIAGNOSIS — F39 Unspecified mood [affective] disorder: Secondary | ICD-10-CM | POA: Diagnosis not present

## 2019-01-08 MED ORDER — LURASIDONE HCL 40 MG PO TABS: 40.0000 mg | ORAL_TABLET | Freq: Every day | ORAL | 1 refills | Status: DC

## 2019-01-08 NOTE — Progress Notes (Signed)
[facility]  Patient ID: Carolyn Freeman,  MRN: [id number]  PCP: Patient, No Pcp Per  Date of Evaluation: 01/08/2019 Time spent:15 minutes  Chief Complaint:  Chief Complaint    Follow-up     Virtual Visit via Telephone Note  I connected with patient by a video enabled telemedicine application or telephone, with their informed consent, and verified patient privacy and that I am speaking with the correct person using two identifiers.  I am private, in my home and the patient is home.   I discussed the limitations, risks, security and privacy concerns of performing an evaluation and management service by telephone and the availability of in person appointments. I also discussed with the patient that there may be a patient responsible charge related to this service. The patient expressed understanding and agreed to proceed.   I discussed the assessment and treatment plan with the patient. The patient was provided an opportunity to ask questions and all were answered. The patient agreed with the plan and demonstrated an understanding of the instructions.   The patient was advised to call back or seek an in-person evaluation if the symptoms worsen or if the condition fails to improve as anticipated.  I provided 15 minutes of non-face-to-face time during this encounter.  HISTORY/CURRENT STATUS: HPI Transferring to my care after her provider and my colleague, Comer Locket, [place], passed away recently.  Lost her income.  Spouse support, and not working.  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  It was so stressful." Hasn't been able to take any of her meds, except Xanax, for 3-4 months now.  The Latuda was really helpful but she just cannot afford it.  States she has tried many other meds which either caused side effects such as abnormal movement or's extreme drowsiness, and she was not able to tolerate them.  Right now she feels pretty sad.  The worst part of her  symptoms is seeing things.  She reports that she will see shadows on the wall.  The shadows usually are floating in her peripheral vision.  She denies auditory hallucinations.  Denies change in energy or motivation.  She is not isolating any more than she has to but states she does not really have the money to do anything anyway.  Denies suicidal or homicidal thoughts.  No increased energy with decreased need for sleep.  No impulsivity, risky behaviors, no grandiosity, no increased libido or spending.  Denies dizziness, syncope, seizures, numbness, tingling, tremor, tics, unsteady gait, slurred speech, confusion. Denies muscle or joint pain, stiffness, or dystonia.  Individual Medical History/ Review of Systems: Changes? :No    Past medications for mental health diagnoses include: Latuda, Xanax, Lithium, Vraylar, Seroquel, Topamax, Mirtazepine, Zyprexa, Abilify,   Allergies: Patient has no known allergies.  Current Medications:  Current Outpatient Medications:  .  ALPRAZolam (XANAX) 1 MG tablet, TAKE 1 TABLET BY MOUTH TWICE A DAY AS NEEDED FOR ANXIETY, Disp: 30 tablet, Rfl: 1 .  amLODipine (NORVASC) 2.5 MG tablet, Take 1 tablet (2.5 mg total) by mouth daily. For high blood pressure (Patient not taking: Reported on 03/09/2018), Disp: 30 tablet, Rfl: 0 .  lisinopril (PRINIVIL,ZESTRIL) 20 MG tablet, Take 1 tablet (20 mg total) by mouth daily. For high blood pressure (Patient not taking: Reported on 03/09/2018), Disp: 30 tablet, Rfl: 0 .  lurasidone (LATUDA) 40 MG TABS tablet, Take 1 tablet (40 mg total) by mouth daily with supper., Disp: 30 tablet, Rfl: 1 .  metFORMIN (GLUCOPHAGE) 500 MG tablet, Take 1 tablet (500 mg total) by mouth 2 (two) times daily with a meal. 1d/wk, 2/d (Patient not taking: Reported on 01/08/2019), Disp: 60 tablet, Rfl: 1 Medication Side Effects: none  Family Medical/ Social History: Changes?  See HPI.  MENTAL HEALTH EXAM:  There were no vitals taken for this visit.There  is no height or weight on file to calculate BMI.  General Appearance: unable to assess  Eye Contact:  unable to assess  Speech:  Clear and Coherent  Volume:  Normal  Mood:  Euthymic  Affect:  unable to assess  Thought Process:  Goal Directed  Orientation:  Full (Time, Place, and Person)  Thought Content: Logical   Suicidal Thoughts:  No  Homicidal Thoughts:  No  Memory:  WNL  Judgement:  Good  Insight:  Good  Psychomotor Activity:  unable to assess  Concentration:  Concentration: Good  Recall:  Good  Fund of Knowledge: Good  Language: Good  Assets:  Desire for Improvement  ADL's:  Intact  Cognition: WNL  Prognosis:  Good    DIAGNOSES:    ICD-10-CM   1. Episodic mood disorder (HCC)  F39   2. Visual hallucinations  R44.1   3. Generalized anxiety disorder  F41.1     Receiving Psychotherapy: No    RECOMMENDATIONS:  We will do everything we can to try to get her back on the Latuda, including having her complete an application for indigent care.  In the meantime we will give her samples. Restart Latuda 40 mg p.o. q. evening with food. Continue Xanax 1 mg, 1 p.o. twice daily as needed. Return in 6 weeks.  Donnal Moat, PA-C   This record has been created using [employer].  Chart creation errors have been sought, but may not always have been located and corrected. Such creation errors do not reflect on the standard of medical care.

## 2019-01-30 ENCOUNTER — Telehealth: Payer: Self-pay | Admitting: Physician Assistant

## 2019-01-30 ENCOUNTER — Other Ambulatory Visit: Payer: Self-pay | Admitting: Physician Assistant

## 2019-01-30 NOTE — Telephone Encounter (Signed)
Carolyn Freeman, please call her and let her know she CAN NOT double up on xanax.  Only 1 po bid prn.  Ask her about the Seroquel, Abilify, Zyprexa that she's taken before.  Did they help?  Those will be the cheapest.  Or we can try Geodon.

## 2019-01-30 NOTE — Telephone Encounter (Signed)
See phone message

## 2019-01-30 NOTE — Telephone Encounter (Signed)
Spoke with Pharmacist. Her co-pay is $40. He stated that she does not need a PA for this. Please advise.

## 2019-01-30 NOTE — Telephone Encounter (Signed)
Patient cannot afford copay for Latuda, would like to be put on something else, would like for you to double up on Xanax she is having to take 2 in the am and 2 in the pm, [facility] [place]

## 2019-01-31 NOTE — Telephone Encounter (Signed)
Left voice mail to call back 

## 2019-03-01 ENCOUNTER — Other Ambulatory Visit: Payer: Self-pay | Admitting: Physician Assistant

## 2019-03-01 NOTE — Telephone Encounter (Signed)
Last visit 08/31 due in 6 weeks. Nothing scheduled

## 2019-03-02 NOTE — Telephone Encounter (Signed)
Please have her set up appt. I'll go ahead and send this in.

## 2019-03-27 ENCOUNTER — Other Ambulatory Visit: Payer: Self-pay

## 2019-03-27 ENCOUNTER — Telehealth: Payer: Self-pay | Admitting: Physician Assistant

## 2019-03-27 MED ORDER — ALPRAZOLAM 1 MG PO TABS: ORAL_TABLET | ORAL | 1 refills | Status: DC

## 2019-03-27 NOTE — Telephone Encounter (Signed)
Last refill 03/02/2019, pended for approval

## 2019-03-27 NOTE — Telephone Encounter (Signed)
Patient called and we rescheduled appt  with teresa for 12/18 . She [facility] a [facility] [place]

## 2019-03-29 ENCOUNTER — Ambulatory Visit: Payer: BC Managed Care – PPO | Admitting: Physician Assistant

## 2019-04-27 ENCOUNTER — Ambulatory Visit: Payer: BC Managed Care – PPO | Admitting: Physician Assistant

## 2019-05-06 ENCOUNTER — Inpatient Hospital Stay (HOSPITAL_COMMUNITY): Payer: BC Managed Care – PPO | Admitting: Anesthesiology

## 2019-05-06 ENCOUNTER — Inpatient Hospital Stay (HOSPITAL_COMMUNITY): Payer: BC Managed Care – PPO

## 2019-05-06 ENCOUNTER — Inpatient Hospital Stay (HOSPITAL_COMMUNITY)
Admission: EM | Admit: 2019-05-06 | Discharge: 2019-05-17 | DRG: 021 | Disposition: A | Discharge status: Home Health | Payer: BC Managed Care – PPO | Attending: Neurosurgery | Admitting: Neurosurgery

## 2019-05-06 ENCOUNTER — Encounter (HOSPITAL_COMMUNITY): Payer: Self-pay | Admitting: Radiology

## 2019-05-06 ENCOUNTER — Emergency Department (HOSPITAL_COMMUNITY): Payer: BC Managed Care – PPO

## 2019-05-06 ENCOUNTER — Encounter (HOSPITAL_COMMUNITY)
Admission: EM | Disposition: A | Discharge status: Home Health | Payer: Self-pay | Source: Home / Self Care | Attending: Neurosurgery

## 2019-05-06 ENCOUNTER — Other Ambulatory Visit: Payer: Self-pay

## 2019-05-06 DIAGNOSIS — F17290 Nicotine dependence, other tobacco product, uncomplicated: Secondary | ICD-10-CM | POA: Diagnosis present

## 2019-05-06 DIAGNOSIS — R739 Hyperglycemia, unspecified: Secondary | ICD-10-CM | POA: Diagnosis not present

## 2019-05-06 DIAGNOSIS — T380X5A Adverse effect of glucocorticoids and synthetic analogues, initial encounter: Secondary | ICD-10-CM | POA: Diagnosis not present

## 2019-05-06 DIAGNOSIS — Z20828 Contact with and (suspected) exposure to other viral communicable diseases: Secondary | ICD-10-CM | POA: Diagnosis present

## 2019-05-06 DIAGNOSIS — I602 Nontraumatic subarachnoid hemorrhage from anterior communicating artery: Secondary | ICD-10-CM | POA: Diagnosis present

## 2019-05-06 DIAGNOSIS — I10 Essential (primary) hypertension: Secondary | ICD-10-CM | POA: Diagnosis present

## 2019-05-06 DIAGNOSIS — R509 Fever, unspecified: Secondary | ICD-10-CM

## 2019-05-06 DIAGNOSIS — Z7984 Long term (current) use of oral hypoglycemic drugs: Secondary | ICD-10-CM | POA: Diagnosis not present

## 2019-05-06 DIAGNOSIS — H538 Other visual disturbances: Secondary | ICD-10-CM | POA: Diagnosis present

## 2019-05-06 DIAGNOSIS — R52 Pain, unspecified: Secondary | ICD-10-CM | POA: Diagnosis not present

## 2019-05-06 DIAGNOSIS — I729 Aneurysm of unspecified site: Secondary | ICD-10-CM

## 2019-05-06 DIAGNOSIS — R29810 Facial weakness: Secondary | ICD-10-CM | POA: Diagnosis present

## 2019-05-06 DIAGNOSIS — I609 Nontraumatic subarachnoid hemorrhage, unspecified: Secondary | ICD-10-CM

## 2019-05-06 DIAGNOSIS — N179 Acute kidney failure, unspecified: Secondary | ICD-10-CM | POA: Diagnosis not present

## 2019-05-06 DIAGNOSIS — F419 Anxiety disorder, unspecified: Secondary | ICD-10-CM | POA: Diagnosis present

## 2019-05-06 DIAGNOSIS — E876 Hypokalemia: Secondary | ICD-10-CM | POA: Diagnosis not present

## 2019-05-06 HISTORY — PX: IR ANGIO VERTEBRAL SEL VERTEBRAL BILAT MOD SED: IMG5369

## 2019-05-06 HISTORY — PX: IR ANGIO INTRA EXTRACRAN SEL INTERNAL CAROTID BILAT MOD SED: IMG5363

## 2019-05-06 HISTORY — PX: IR TRANSCATH/EMBOLIZ: IMG695

## 2019-05-06 HISTORY — PX: IR NEURO EACH ADD'L AFTER BASIC UNI RIGHT (MS): IMG5374

## 2019-05-06 HISTORY — PX: IR ANGIOGRAM FOLLOW UP STUDY: IMG697

## 2019-05-06 HISTORY — PX: RADIOLOGY WITH ANESTHESIA: SHX6223

## 2019-05-06 HISTORY — DX: Neoplasm of unspecified behavior of brain: D49.6

## 2019-05-06 LAB — PROTIME-INR
INR: 0.9 (ref 0.8–1.2)
Prothrombin Time: 12.1 seconds (ref 11.4–15.2)

## 2019-05-06 LAB — CBC
HCT: 44.2 % (ref 36.0–46.0)
Hemoglobin: 14.2 g/dL (ref 12.0–15.0)
MCH: 27 pg (ref 26.0–34.0)
MCHC: 32.1 g/dL (ref 30.0–36.0)
MCV: 84.2 fL (ref 80.0–100.0)
Platelets: 300 10*3/uL (ref 150–400)
RBC: 5.25 MIL/uL — ABNORMAL HIGH (ref 3.87–5.11)
RDW: 14.7 % (ref 11.5–15.5)
WBC: 9.8 10*3/uL (ref 4.0–10.5)
nRBC: 0 % (ref 0.0–0.2)

## 2019-05-06 LAB — COMPREHENSIVE METABOLIC PANEL
ALT: 15 U/L (ref 0–44)
AST: 20 U/L (ref 15–41)
Albumin: 3.9 g/dL (ref 3.5–5.0)
Alkaline Phosphatase: 73 U/L (ref 38–126)
Anion gap: 11 (ref 5–15)
BUN: 15 mg/dL (ref 6–20)
CO2: 22 mmol/L (ref 22–32)
Calcium: 9.2 mg/dL (ref 8.9–10.3)
Chloride: 106 mmol/L (ref 98–111)
Creatinine, Ser: 0.94 mg/dL (ref 0.44–1.00)
GFR calc Af Amer: >60 mL/min (ref >60)
GFR calc non Af Amer: >60 mL/min (ref >60)
Glucose, Bld: 190 mg/dL — ABNORMAL HIGH (ref 70–99)
Potassium: 3.3 mmol/L — ABNORMAL LOW (ref 3.5–5.1)
Sodium: 139 mmol/L (ref 135–145)
Total Bilirubin: 0.4 mg/dL (ref 0.3–1.2)
Total Protein: 6.5 g/dL (ref 6.5–8.1)

## 2019-05-06 LAB — RESPIRATORY PANEL BY RT PCR (FLU A&B, COVID)
Influenza A by PCR: NEGATIVE
Influenza B by PCR: NEGATIVE
SARS Coronavirus 2 by RT PCR: NEGATIVE

## 2019-05-06 LAB — DIFFERENTIAL
Abs Immature Granulocytes: 0.03 10*3/uL (ref 0.00–0.07)
Basophils Absolute: 0.1 10*3/uL (ref 0.0–0.1)
Basophils Relative: 1 %
Eosinophils Absolute: 0.5 10*3/uL (ref 0.0–0.5)
Eosinophils Relative: 5 %
Immature Granulocytes: 0 %
Lymphocytes Relative: 59 %
Lymphs Abs: 5.8 10*3/uL — ABNORMAL HIGH (ref 0.7–4.0)
Monocytes Absolute: 0.8 10*3/uL (ref 0.1–1.0)
Monocytes Relative: 8 %
Neutro Abs: 2.6 10*3/uL (ref 1.7–7.7)
Neutrophils Relative %: 27 %

## 2019-05-06 LAB — GLUCOSE, CAPILLARY
Glucose-Capillary: 170 mg/dL — ABNORMAL HIGH (ref 70–99)
Glucose-Capillary: 164 mg/dL — ABNORMAL HIGH (ref 70–99)

## 2019-05-06 LAB — I-STAT BETA HCG BLOOD, ED (MC, WL, AP ONLY): I-stat hCG, quantitative: <5 m[IU]/mL (ref <5)

## 2019-05-06 LAB — I-STAT CHEM 8, ED
BUN: 17 mg/dL (ref 6–20)
Calcium, Ion: 1.16 mmol/L (ref 1.15–1.40)
Chloride: 106 mmol/L (ref 98–111)
Creatinine, Ser: 0.8 mg/dL (ref 0.44–1.00)
Glucose, Bld: 187 mg/dL — ABNORMAL HIGH (ref 70–99)
HCT: 44 % (ref 36.0–46.0)
Hemoglobin: 15 g/dL (ref 12.0–15.0)
Potassium: 3.1 mmol/L — ABNORMAL LOW (ref 3.5–5.1)
Sodium: 140 mmol/L (ref 135–145)
TCO2: 24 mmol/L (ref 22–32)

## 2019-05-06 LAB — HEMOGLOBIN A1C
Hgb A1c MFr Bld: 5.7 % — ABNORMAL HIGH (ref 4.8–5.6)
Mean Plasma Glucose: 116.89 mg/dL

## 2019-05-06 LAB — CBG MONITORING, ED
Glucose-Capillary: 177 mg/dL — ABNORMAL HIGH (ref 70–99)
Glucose-Capillary: 185 mg/dL — ABNORMAL HIGH (ref 70–99)

## 2019-05-06 LAB — MRSA PCR SCREENING: MRSA by PCR: NEGATIVE

## 2019-05-06 LAB — HIV ANTIBODY (ROUTINE TESTING W REFLEX): HIV Screen 4th Generation wRfx: NONREACTIVE

## 2019-05-06 LAB — APTT: aPTT: 25 seconds (ref 24–36)

## 2019-05-06 SURGERY — RADIOLOGY WITH ANESTHESIA
Anesthesia: General

## 2019-05-06 MED ORDER — ONDANSETRON HCL 4 MG/2ML IJ SOLN
4.0000 mg | INTRAMUSCULAR | Status: DC | PRN
  Administered 2019-05-06 – 2019-05-15 (×7): 4 mg via INTRAVENOUS
  Filled 2019-05-06 (×6): qty 2

## 2019-05-06 MED ORDER — NIMODIPINE 30 MG PO CAPS
60.0000 mg | ORAL_CAPSULE | ORAL | Status: DC
  Administered 2019-05-06 – 2019-05-17 (×67): 60 mg via ORAL
  Filled 2019-05-06 (×69): qty 2

## 2019-05-06 MED ORDER — ONDANSETRON HCL 4 MG/2ML IJ SOLN
4.0000 mg | Freq: Once | INTRAMUSCULAR | Status: AC
  Administered 2019-05-06: 4 mg via INTRAVENOUS
  Filled 2019-05-06: qty 2

## 2019-05-06 MED ORDER — LISINOPRIL 20 MG PO TABS
20.0000 mg | ORAL_TABLET | Freq: Every day | ORAL | Status: DC
  Administered 2019-05-07 – 2019-05-08 (×2): 20 mg via ORAL
  Filled 2019-05-06 (×2): qty 1

## 2019-05-06 MED ORDER — CHLORHEXIDINE GLUCONATE CLOTH 2 % EX PADS
6.0000 | MEDICATED_PAD | Freq: Every day | CUTANEOUS | Status: DC
  Administered 2019-05-06 – 2019-05-16 (×11): 6 via TOPICAL

## 2019-05-06 MED ORDER — ONDANSETRON HCL 4 MG/2ML IJ SOLN
INTRAMUSCULAR | Status: DC | PRN
  Administered 2019-05-06: 4 mg via INTRAVENOUS

## 2019-05-06 MED ORDER — WHITE PETROLATUM EX OINT
TOPICAL_OINTMENT | CUTANEOUS | Status: AC
  Administered 2019-05-06: 0.2
  Filled 2019-05-06: qty 28.35

## 2019-05-06 MED ORDER — CLEVIDIPINE BUTYRATE 0.5 MG/ML IV EMUL
0.0000 mg/h | INTRAVENOUS | Status: DC
  Administered 2019-05-06: 10:00:00 16 mg/h via INTRAVENOUS
  Administered 2019-05-06: 8 mg/h via INTRAVENOUS
  Administered 2019-05-06: 10 mg/h via INTRAVENOUS
  Administered 2019-05-06: 8 mg/h via INTRAVENOUS
  Administered 2019-05-07: 10 mg/h via INTRAVENOUS
  Filled 2019-05-06 (×3): qty 50
  Filled 2019-05-06: qty 100

## 2019-05-06 MED ORDER — CLEVIDIPINE BUTYRATE 0.5 MG/ML IV EMUL
INTRAVENOUS | Status: AC
  Filled 2019-05-06: qty 50

## 2019-05-06 MED ORDER — IOHEXOL 300 MG/ML  SOLN
150.0000 mL | Freq: Once | INTRAMUSCULAR | Status: AC | PRN
  Administered 2019-05-06: 65 mL via INTRA_ARTERIAL

## 2019-05-06 MED ORDER — ACETAMINOPHEN 325 MG PO TABS: 650.0000 mg | ORAL_TABLET | ORAL | Status: DC | PRN

## 2019-05-06 MED ORDER — HYDROCODONE-ACETAMINOPHEN 10-325 MG PO TABS
1.0000 | ORAL_TABLET | ORAL | Status: DC | PRN
  Administered 2019-05-07 (×4): 2 via ORAL
  Administered 2019-05-08: 1 via ORAL
  Administered 2019-05-08 – 2019-05-09 (×2): 2 via ORAL
  Filled 2019-05-06 (×7): qty 2

## 2019-05-06 MED ORDER — EPHEDRINE SULFATE 50 MG/ML IJ SOLN
INTRAMUSCULAR | Status: DC | PRN
  Administered 2019-05-06: 5 mg via INTRAVENOUS

## 2019-05-06 MED ORDER — ACETAMINOPHEN 650 MG RE SUPP: 650.0000 mg | RECTAL | Status: DC | PRN

## 2019-05-06 MED ORDER — CLEVIDIPINE BUTYRATE 0.5 MG/ML IV EMUL
INTRAVENOUS | Status: AC
  Administered 2019-05-06: 16 mg/h via INTRAVENOUS
  Filled 2019-05-06: qty 50

## 2019-05-06 MED ORDER — SUGAMMADEX SODIUM 200 MG/2ML IV SOLN
INTRAVENOUS | Status: DC | PRN
  Administered 2019-05-06: 200 mg via INTRAVENOUS

## 2019-05-06 MED ORDER — INSULIN ASPART 100 UNIT/ML ~~LOC~~ SOLN
0.0000 [IU] | SUBCUTANEOUS | Status: DC
  Administered 2019-05-06 (×3): 3 [IU] via SUBCUTANEOUS
  Administered 2019-05-07: 2 [IU] via SUBCUTANEOUS

## 2019-05-06 MED ORDER — CLEVIDIPINE BUTYRATE 0.5 MG/ML IV EMUL
INTRAVENOUS | Status: AC
  Administered 2019-05-06: 8 mg/h via INTRAVENOUS
  Filled 2019-05-06: qty 50

## 2019-05-06 MED ORDER — DEXAMETHASONE SODIUM PHOSPHATE 10 MG/ML IJ SOLN
INTRAMUSCULAR | Status: DC | PRN
  Administered 2019-05-06: 10 mg via INTRAVENOUS

## 2019-05-06 MED ORDER — IOHEXOL 350 MG/ML SOLN
50.0000 mL | Freq: Once | INTRAVENOUS | Status: AC | PRN
  Administered 2019-05-06: 50 mL via INTRAVENOUS

## 2019-05-06 MED ORDER — SENNOSIDES-DOCUSATE SODIUM 8.6-50 MG PO TABS
1.0000 | ORAL_TABLET | Freq: Two times a day (BID) | ORAL | Status: DC
  Administered 2019-05-06 – 2019-05-13 (×14): 1 via ORAL
  Filled 2019-05-06 (×17): qty 1

## 2019-05-06 MED ORDER — HYDROMORPHONE HCL 1 MG/ML IJ SOLN
0.5000 mg | INTRAMUSCULAR | Status: DC | PRN
  Administered 2019-05-07 – 2019-05-09 (×2): 0.5 mg via INTRAVENOUS
  Filled 2019-05-06 (×2): qty 1

## 2019-05-06 MED ORDER — ACETAMINOPHEN 160 MG/5ML PO SOLN: 650.0000 mg | ORAL | Status: DC | PRN

## 2019-05-06 MED ORDER — STROKE: EARLY STAGES OF RECOVERY BOOK
Freq: Once | Status: DC
  Filled 2019-05-06: qty 1

## 2019-05-06 MED ORDER — SODIUM CHLORIDE 0.9% FLUSH
3.0000 mL | Freq: Once | INTRAVENOUS | Status: AC
  Administered 2019-05-06: 08:00:00 3 mL via INTRAVENOUS

## 2019-05-06 MED ORDER — NIMODIPINE 6 MG/ML PO SOLN
60.0000 mg | ORAL | Status: DC
  Filled 2019-05-06 (×4): qty 10

## 2019-05-06 MED ORDER — FENTANYL CITRATE (PF) 250 MCG/5ML IJ SOLN
INTRAMUSCULAR | Status: DC | PRN
  Administered 2019-05-06 (×4): 50 ug via INTRAVENOUS

## 2019-05-06 MED ORDER — LIDOCAINE 2% (20 MG/ML) 5 ML SYRINGE
INTRAMUSCULAR | Status: DC | PRN
  Administered 2019-05-06: 100 mg via INTRAVENOUS

## 2019-05-06 MED ORDER — MORPHINE SULFATE (PF) 4 MG/ML IV SOLN
4.0000 mg | Freq: Once | INTRAVENOUS | Status: AC
  Administered 2019-05-06: 4 mg via INTRAVENOUS
  Filled 2019-05-06: qty 1

## 2019-05-06 MED ORDER — ACETAMINOPHEN-CODEINE #3 300-30 MG PO TABS
1.0000 | ORAL_TABLET | ORAL | Status: DC | PRN
  Administered 2019-05-06 (×2): 2 via ORAL
  Administered 2019-05-07: 1 via ORAL
  Administered 2019-05-07 – 2019-05-08 (×3): 2 via ORAL
  Filled 2019-05-06 (×3): qty 2
  Filled 2019-05-06: qty 1
  Filled 2019-05-06 (×2): qty 2

## 2019-05-06 MED ORDER — GLYCOPYRROLATE 0.2 MG/ML IJ SOLN
INTRAMUSCULAR | Status: DC | PRN
  Administered 2019-05-06: .1 mg via INTRAVENOUS

## 2019-05-06 MED ORDER — SODIUM CHLORIDE 0.9 % IV SOLN: INTRAVENOUS | Status: DC

## 2019-05-06 MED ORDER — CLEVIDIPINE BUTYRATE 0.5 MG/ML IV EMUL: 0.0000 mg/h | INTRAVENOUS | Status: AC

## 2019-05-06 MED ORDER — MORPHINE SULFATE (PF) 2 MG/ML IV SOLN
1.0000 mg | INTRAVENOUS | Status: DC | PRN
  Administered 2019-05-06 – 2019-05-07 (×6): 2 mg via INTRAVENOUS
  Administered 2019-05-07: 1 mg via INTRAVENOUS
  Administered 2019-05-08 (×3): 2 mg via INTRAVENOUS
  Administered 2019-05-09: 4 mg via INTRAVENOUS
  Administered 2019-05-09 (×3): 2 mg via INTRAVENOUS
  Filled 2019-05-06 (×5): qty 1
  Filled 2019-05-06: qty 2
  Filled 2019-05-06 (×7): qty 1
  Filled 2019-05-06: qty 2
  Filled 2019-05-06: qty 1

## 2019-05-06 MED ORDER — ROCURONIUM BROMIDE 10 MG/ML (PF) SYRINGE
PREFILLED_SYRINGE | INTRAVENOUS | Status: DC | PRN
  Administered 2019-05-06: 10 mg via INTRAVENOUS
  Administered 2019-05-06: 90 mg via INTRAVENOUS

## 2019-05-06 MED ORDER — PROPOFOL 10 MG/ML IV BOLUS
INTRAVENOUS | Status: DC | PRN
  Administered 2019-05-06: 200 mg via INTRAVENOUS

## 2019-05-06 MED ORDER — ALPRAZOLAM 0.5 MG PO TABS
0.5000 mg | ORAL_TABLET | Freq: Three times a day (TID) | ORAL | Status: DC | PRN
  Administered 2019-05-06 – 2019-05-09 (×7): 0.5 mg via ORAL
  Filled 2019-05-06 (×9): qty 1

## 2019-05-06 MED ORDER — AMLODIPINE BESYLATE 2.5 MG PO TABS
2.5000 mg | ORAL_TABLET | Freq: Every day | ORAL | Status: DC
  Administered 2019-05-07 – 2019-05-08 (×2): 2.5 mg via ORAL
  Filled 2019-05-06 (×2): qty 1

## 2019-05-06 MED ORDER — LURASIDONE HCL 40 MG PO TABS: 40.0000 mg | ORAL_TABLET | Freq: Every day | ORAL | Status: DC

## 2019-05-06 MED ORDER — PANTOPRAZOLE SODIUM 40 MG IV SOLR
40.0000 mg | Freq: Every day | INTRAVENOUS | Status: DC
  Administered 2019-05-06: 40 mg via INTRAVENOUS
  Filled 2019-05-06: qty 40

## 2019-05-06 MED ORDER — PHENYLEPHRINE HCL-NACL 10-0.9 MG/250ML-% IV SOLN
INTRAVENOUS | Status: DC | PRN
  Administered 2019-05-06: 20 ug/min via INTRAVENOUS

## 2019-05-06 NOTE — Sedation Documentation (Signed)
RIGHT femoral artery puncture.

## 2019-05-06 NOTE — Sedation Documentation (Signed)
PACU aware of need for recovery. SBAR called to [place], RN 4N. All questions answered to her satisfaction.

## 2019-05-06 NOTE — Sedation Documentation (Signed)
4N17 clean and ready per [facility] in pt placement. Spoke with Gerald Stabs, pt transport, requested bed be brought to IR2.

## 2019-05-06 NOTE — ED Triage Notes (Signed)
Pt here from home where she woke up this morning at 0600 per husband with severe headache, L sided weakness, nausea, slurred speech, and AMS. Initial reported LKW was 0600, but pt sts she woke up with the symptoms. Went to bed after drinking at approx 2100 last night.

## 2019-05-06 NOTE — Progress Notes (Signed)
  NEUROSURGERY PROGRESS NOTE   Admission hx reviewed. Presented this am with severe HA, no other neurologic complaints. Has medical hx of elective LICA aneurysm treated with [employer] device by Dr. Harvel Ricks in 2016 with f/u angiogram confirming aneurysm occlusion.  EXAM:  BP 120/80   Pulse (!) 58   Temp (!) 96.1 F (35.6 C) (Tympanic)   Resp (!) 22   Ht 5\' 3"  (1.6 m)   Wt 63.5 kg   SpO2 100%   BMI 24.80 kg/m   Awake, alert, oriented  Speech fluent, appropriate  CN grossly intact  5/5 BUE/BLE   IMAGING: CT reviewed demonstrates diffuse basal/sylvian SAH. No HCP. CTA personally reviewed demonstrating rightward projecting apparently narrow-neck Acom aneurysm. The left A1 is dominant although the right A1 is patent. There is a stent within the cavernous/supraclinoid LICA without ophthalmic aneurysm seen.  IMPRESSION:  53 y.o. 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 Specifically, we did talk about the risk of stroke (ischemic/hemorrhage) which is lower than the risk of aneurysm rebleeding. She provided consent to proceed.

## 2019-05-06 NOTE — H&P (Signed)
Reason for Consult:SAH Referring Physician: Nikira Freeman is an 53 y.o. female.  HPI: (Per Dr. Leonel Ramsay) Carolyn Freeman is a 53 y.o. female who presents with nausea/vomiting and headache that started on awakening this morning.  EMS reported that she had some left-sided weakness as well, but the patient denies that she ever had focal weakness.  On arrival, she was nonfocal, though slightly drowsy.  She was taken for an emergent CT scan which demonstrated a large subarachnoid hemorrhage.  Following this, I initiated clevidipine for blood pressure control.    LKW: 9 PM, though likely the hemorrhage happened closer to 6 AM Hunt and hess: 3(for drowsiness)  On my exam, patient is drowsy, but arousable.  She is c/o headache and back ache, but not currently of nausea.  She has had prior stenting of aneurysm in 2015 and 2017.    She is married with 4 children, the youngest is 45.    Past Medical History:  Diagnosis Date  . Anxiety   . Brain tumor ([place])   . Depression   . Suicidal behavior     Past Surgical History:  Procedure Laterality Date  . annuresym      No family history on file.  Social History:  reports that she has quit smoking. She uses smokeless tobacco. She reports current alcohol use. She reports current drug use. Drug: Marijuana.  Allergies: No Known Allergies  Medications: I have reviewed the patient's current medications.  Results for orders placed or performed during the hospital encounter of 05/06/19 (from the past 48 hour(s))  CBG monitoring, ED     Status: Abnormal   Collection Time: 05/06/19  7:13 AM  Result Value Ref Range   Glucose-Capillary 177 (H) 70 - 99 mg/dL  Protime-INR     Status: None   Collection Time: 05/06/19  7:14 AM  Result Value Ref Range   Prothrombin Time 12.1 11.4 - 15.2 seconds   INR 0.9 0.8 - 1.2    Comment: (NOTE) INR goal varies based on device and disease states. Performed at [facility], [place]  [address], [place], [place] [address]   APTT     Status: None   Collection Time: 05/06/19  7:14 AM  Result Value Ref Range   aPTT 25 24 - 36 seconds    Comment: Performed at [facility] [address] [place], [place] [address]  CBC     Status: Abnormal   Collection Time: 05/06/19  7:14 AM  Result Value Ref Range   WBC 9.8 4.0 - 10.5 K/uL   RBC 5.25 (H) 3.87 - 5.11 MIL/uL   Hemoglobin 14.2 12.0 - 15.0 g/dL   HCT 44.2 36.0 - 46.0 %   MCV 84.2 80.0 - 100.0 fL   MCH 27.0 26.0 - 34.0 pg   MCHC 32.1 30.0 - 36.0 g/dL   RDW 14.7 11.5 - 15.5 %   Platelets 300 150 - 400 K/uL   nRBC 0.0 0.0 - 0.2 %    Comment: Performed at [facility], [place] [address] [place], [place] [address]  Differential     Status: Abnormal   Collection Time: 05/06/19  7:14 AM  Result Value Ref Range   Neutrophils Relative % 27 %   Neutro Abs 2.6 1.7 - 7.7 K/uL   Lymphocytes Relative 59 %   Lymphs Abs 5.8 (H) 0.7 - 4.0 K/uL   Monocytes Relative 8 %   Monocytes Absolute 0.8 0.1 - 1.0 K/uL  Eosinophils Relative 5 %   Eosinophils Absolute 0.5 0.0 - 0.5 K/uL   Basophils Relative 1 %   Basophils Absolute 0.1 0.0 - 0.1 K/uL   Immature Granulocytes 0 %   Abs Immature Granulocytes 0.03 0.00 - 0.07 K/uL    Comment: Performed at [facility] [address], [place], [place] [address]  Comprehensive metabolic panel     Status: Abnormal   Collection Time: 05/06/19  7:14 AM  Result Value Ref Range   Sodium 139 135 - 145 mmol/L   Potassium 3.3 (L) 3.5 - 5.1 mmol/L   Chloride 106 98 - 111 mmol/L   CO2 22 22 - 32 mmol/L   Glucose, Bld 190 (H) 70 - 99 mg/dL   BUN 15 6 - 20 mg/dL   Creatinine, Ser 0.94 0.44 - 1.00 mg/dL   Calcium 9.2 8.9 - 10.3 mg/dL   Total Protein 6.5 6.5 - 8.1 g/dL   Albumin 3.9 3.5 - 5.0 g/dL   AST 20 15 - 41 U/L   ALT 15 0 - 44 U/L   Alkaline Phosphatase 73 38 - 126 U/L   Total Bilirubin 0.4 0.3 - 1.2 mg/dL   GFR calc non Af Amer >60 >60 mL/min   GFR calc Af Amer >60 >60 mL/min    Anion gap 11 5 - 15    Comment: Performed at [facility], [place] [address], [place], [place] [address]  I-Stat beta hCG blood, ED     Status: None   Collection Time: 05/06/19  7:19 AM  Result Value Ref Range   I-stat hCG, quantitative <5.0 <5 mIU/mL   Comment 3            Comment:   GEST. AGE      CONC.  (mIU/mL)   <=1 WEEK        5 - 50     2 WEEKS       50 - 500     3 WEEKS       100 - 10,000     4 WEEKS     1,000 - 30,000        FEMALE AND NON-PREGNANT FEMALE:     LESS THAN 5 mIU/mL   I-stat chem 8, ED     Status: Abnormal   Collection Time: 05/06/19  7:23 AM  Result Value Ref Range   Sodium 140 135 - 145 mmol/L   Potassium 3.1 (L) 3.5 - 5.1 mmol/L   Chloride 106 98 - 111 mmol/L   BUN 17 6 - 20 mg/dL   Creatinine, Ser 0.80 0.44 - 1.00 mg/dL   Glucose, Bld 187 (H) 70 - 99 mg/dL   Calcium, Ion 1.16 1.15 - 1.40 mmol/L   TCO2 24 22 - 32 mmol/L   Hemoglobin 15.0 12.0 - 15.0 g/dL   HCT 44.0 36.0 - 46.0 %  Respiratory Panel by RT PCR (Flu A&B, Covid) - Nasopharyngeal Swab     Status: None   Collection Time: 05/06/19  8:12 AM   Specimen: Nasopharyngeal Swab  Result Value Ref Range   SARS Coronavirus 2 by RT PCR NEGATIVE NEGATIVE    Comment: (NOTE) SARS-CoV-2 target nucleic acids are NOT DETECTED. The SARS-CoV-2 RNA is generally detectable in upper respiratoy specimens during the acute phase of infection. The lowest concentration of SARS-CoV-2 viral copies this assay can detect is 131 copies/mL. A negative result does not preclude SARS-Cov-2 infection and should not be used as the sole basis  for treatment or other patient management decisions. A negative result may occur with  improper specimen collection/handling, submission of specimen other than nasopharyngeal swab, presence of viral mutation(s) within the areas targeted by this assay, and inadequate number of viral copies (<131 copies/mL). A negative result must be combined with clinical observations, patient  history, and epidemiological information. The expected result is Negative. Fact Sheet for Patients:  PinkCheek.be Fact Sheet for Healthcare Providers:  GravelBags.it This test is not yet ap proved or cleared by the [place] FDA and  has been authorized for detection and/or diagnosis of SARS-CoV-2 by FDA under an Emergency Use Authorization (EUA). This EUA will remain  in effect (meaning this test can be used) for the duration of the COVID-19 declaration under Section 564(b)(1) of the Act, 21 U.S.C. section 360bbb-3(b)(1), unless the authorization is terminated or revoked sooner.    Influenza A by PCR NEGATIVE NEGATIVE   Influenza B by PCR NEGATIVE NEGATIVE    Comment: (NOTE) The Xpert Xpress SARS-CoV-2/FLU/RSV assay is intended as an aid in  the diagnosis of influenza from Nasopharyngeal swab specimens and  should not be used as a sole basis for treatment. Nasal washings and  aspirates are unacceptable for Xpert Xpress SARS-CoV-2/FLU/RSV  testing. Fact Sheet for Patients: PinkCheek.be Fact Sheet for Healthcare Providers: GravelBags.it This test is not yet approved or cleared by the [place] FDA and  has been authorized for detection and/or diagnosis of SARS-CoV-2 by  FDA under an Emergency Use Authorization (EUA). This EUA will remain  in effect (meaning this test can be used) for the duration of the  Covid-19 declaration under Section 564(b)(1) of the Act, 21  U.S.C. section 360bbb-3(b)(1), unless the authorization is  terminated or revoked. Performed at [facility], [place] [address], [place],  [address]     CTA head filled.  Result Date: 05/06/2019 CLINICAL DATA:  53 year old female with aneurysm rupture pattern of subarachnoid hemorrhage on head CT for altered mental status weakness headache and vomiting. EXAM: CT ANGIOGRAPHY HEAD  TECHNIQUE: Multidetector CT imaging of the head was performed using the standard protocol during bolus administration of intravenous contrast. Multiplanar CT image reconstructions and MIPs were obtained to evaluate the vascular anatomy. CONTRAST:  5mL OMNIPAQUE IOHEXOL 350 MG/ML SOLN COMPARISON:  Plain head CT at 0719 hours today. FINDINGS: Posterior circulation: Patent distal vertebral arteries, the right appears dominant and the left functionally terminates in PICA. No distal vertebral stenosis. The right AICA appears dominant. Patent basilar artery without stenosis. The basilar tip appears normal. Fetal type right PCA origin. The left posterior communicating artery is also present. Normal SCA origins. Bilateral PCA branches are within normal limits. Anterior circulation: Highly tortuous distal cervical right ICA without plaque or stenosis. Patent right ICA siphon without plaque or stenosis. Right ophthalmic artery origin appears normal. Normal right posterior communicating artery origin. Normal right ICA terminus. Normal right MCA origin. Right MCA M1 segment is mildly tortuous. The right MCA bifurcation is within normal limits. There is motion artifact degrading detail of the right MCA branches which appear to remain patent. Negative distal cervical left ICA. The proximal left ICA siphon appears normal. There is a stent in place from the distal cavernous segment through the mid supraclinoid segment. The stent is patent and covers the region of the left ophthalmic artery origin. The left posterior communicating artery origin is distal to the stent and appears normal. Tortuous but otherwise normal left ICA terminus. Normal left MCA and ACA origins. Left  MCA M1 segment is mildly tortuous. Left MCA bifurcation appears normal. Left MCA branch detail degraded by motion but the branches appear to remain patent. Left ACA A1 segment appears dominant, and there may be azygos type ACA anatomy (series 12, image 17). At the  anterior communicating artery and right A2 there is a saccular 5 millimeter aneurysm (series 7, image 108). The right A1 appears relatively diminutive. Azygos type ACA appearance but otherwise ACA branches appear within normal limits allowing for motion degradation. Venous sinuses: Not evaluated due to early contrast timing. Anatomic variants: Dominant right vertebral artery, the left functionally terminates in PICA. Fetal type right PCA origin. Dominant appearing left A1 and azygos type ACA anatomy. Review of the MIP images confirms the above findings IMPRESSION: 1. Positive for a 5 mm saccular Aneurysm of the Anterior Communicating Artery, which is favored to be the source of acute hemorrhage. 2. Note that these images suggest Azygos type ACA anatomy with a dominant Left A1. 3. Patent distal left ICA siphon stent with no adjacent aneurysm or adverse features. 4. Basilar tip, right ophthalmic artery, and bilateral posterior communicating artery origins appear normal. 5. Mildly motion degraded other circle-of-Willis branches appear to be normal. No large vessel occlusion. Preliminary report of this exam discussed by telephone with Dr. Roland Rack on 05/06/2019 at 07:58. At 0742 hours. Electronically Signed   By: Genevie Ann M.D.   On: 05/06/2019 08:00   CT HEAD CODE STROKE WO CONTRAST  Result Date: 05/06/2019 CLINICAL DATA:  Code stroke. 53 year old female with slurred speech, vomiting, and altered mental status. EXAM: CT HEAD WITHOUT CONTRAST TECHNIQUE: Contiguous axial images were obtained from the base of the skull through the vertex without intravenous contrast. COMPARISON:  None. FINDINGS: Brain: Widespread subarachnoid hemorrhage with basilar cistern predominance in a pattern typical of ruptured intracranial aneurysm. The hemorrhage volume is fairly symmetric in both sylvian fissures and the basilar cisterns. Cisterna magna relatively spared. No intraventricular hemorrhage. Mild ventriculomegaly  suspected. No definite parenchymal hematoma. No superimposed acute cortically based infarct. Vascular: Left ICA siphon vascular stent. Skull: No acute osseous abnormality identified. Sinuses/Orbits: Visualized paranasal sinuses and mastoids are clear. Other: Mildly Disconjugate gaze. Otherwise negative orbits. Visualized scalp soft tissues are within normal limits. ASPECTS [facility] Stroke Program Early CT Score) Total score (0-10 with 10 being normal): Not applicable in aneurysmal pattern subarachnoid hemorrhage. IMPRESSION: 1. Widespread subarachnoid hemorrhage compatible with ruptured intracranial aneurysm. There is a left ICA siphon stent in place suggesting prior aneurysm treatment. CTA is pending. 2. Mild ventriculomegaly suspected. No intraventricular hemorrhage. Critical Value/emergent results were discussed by telephone at the time of interpretation on 05/06/2019 at 7:39 am to provider Dr. Roland Rack who verbally acknowledged these results. Electronically Signed   By: Genevie Ann M.D.   On: 05/06/2019 07:44    Review of Systems - Negative except high blood pressure    Blood pressure 116/72, pulse (!) 59, temperature (!) 96.1 F (35.6 C), temperature source Tympanic, resp. rate (!) 22, height 5\' 3"  (1.6 m), weight 63.5 kg, SpO2 100 %. Physical Exam  Constitutional: She is oriented to person, place, and time. She appears well-developed and well-nourished.  HENT:  Head: Normocephalic and atraumatic.  Eyes: Pupils are equal, round, and reactive to light. EOM are normal.  Musculoskeletal:     Cervical back: Normal range of motion.  Neurological: She is alert and oriented to person, place, and time. She has normal strength. No cranial nerve deficit or sensory deficit. GCS eye subscore is  4. GCS verbal subscore is 5. GCS motor subscore is 6.    Assessment/Plan: Patient is 53 year old woman with new onset SAH, likely due to A comm artery aneurysm.  Previously underwent stenting of left  carotid siphon, last in 2017 without demonstrable aneurysm in this location on CTA.  Patient to be admitted to ICU.  Will undergo coiling of aneurysm today with possible surgery if unable to coil aneurysm.  Peggyann Shoals, MD 05/06/2019, 9:11 AM

## 2019-05-06 NOTE — Progress Notes (Addendum)
Patient arrived from PACU to 4N17 with one belonging bag containing pajama bottoms, shirt, bracelet, ring, and 1 pair of earrings.   R groin site Level 0 with palpable pulses. Received report from PACU RN to keep SBP<160

## 2019-05-06 NOTE — Sedation Documentation (Signed)
7Fr sheath removed from RIGHT femoral artery by Armando Reichert, RTR. Hemostasis achieved with Exoseal closure device. Groin level 0, 3+RDP, D+RPT.

## 2019-05-06 NOTE — Anesthesia Procedure Notes (Signed)
Arterial Line Insertion Start/End12/27/2020 11:22 AM, 05/06/2019 11:23 AM Performed by: Shirlyn Goltz, CRNA, CRNA  Patient location: OOR procedure area. Preanesthetic checklist: patient identified, IV checked, site marked, risks and benefits discussed, surgical consent, monitors and equipment checked, pre-op evaluation, timeout performed and anesthesia consent Lidocaine 1% used for infiltration Left, radial was placed Catheter size: 20 G Hand hygiene performed  and maximum sterile barriers used   Attempts: 1 Procedure performed without using ultrasound guided technique. Following insertion, dressing applied and Biopatch. Post procedure assessment: normal  Patient tolerated the procedure well with no immediate complications.

## 2019-05-06 NOTE — Consult Note (Signed)
Neurology Consultation Reason for Consult: Code Stroke Referring Physician: Carin Hock  CC: Headache  History is obtained from:patient  HPI: Carolyn Freeman is a 53 y.o. female who presents with nausea/vomiting and headache that started on awakening this morning.  EMS reported that she had some left-sided weakness as well, but the patient denies that she ever had focal weakness.  On arrival, she was nonfocal, though slightly drowsy.  She was taken for an emergent CT scan which demonstrated a large subarachnoid hemorrhage.  Following this, I initiated clevidipine for blood pressure control.    LKW: 9 PM, though likely the hemorrhage happened closer to 6 AM Hunt and hess: 3(for drowsiness)    ROS: A 14 point ROS was performed and is negative except as noted in the HPI.    Past Medical History:  Diagnosis Date  . Anxiety   . Depression   . Suicidal behavior      Family history:    Social History:  reports that she has quit smoking. She uses smokeless tobacco. She reports current alcohol use. She reports current drug use. Drug: Marijuana.   Exam: Current vital signs: There were no vitals taken for this visit. Vital signs in last 24 hours: BP: ()/()  Arterial Line BP: ()/()    Physical Exam  Constitutional: Appears well-developed and well-nourished.  Psych: Affect appropriate to situation Eyes: No scleral injection HENT: No OP obstrucion MSK: no joint deformities.  Cardiovascular: Normal rate and regular rhythm.  Respiratory: Effort normal, non-labored breathing GI: Soft.  No distension. There is no tenderness.  Skin: WDI  Neuro: Mental Status: Patient is slightly drowsy, but interactive and awake.  Cranial Nerves: II: Visual Fields are full. Pupils are equal, round, and reactive to light.   III,IV, VI: EOMI without ptosis or diploplia.  V: Facial sensation is symmetric to temperature VII: Facial movement is symmetric.  VIII: hearing is intact to voice X:  Uvula elevates symmetrically XI: Shoulder shrug is symmetric. XII: tongue is midline without atrophy or fasciculations.  Motor: Tone is normal. Bulk is normal. 5/5 strength was present in all four extremities.  Sensory: Sensation is symmetric to light touch and temperature in the arms and legs. Deep Tendon Reflexes: 2+ and symmetric in the biceps and patellae.  Plantars: Toes are downgoing bilaterally.  Cerebellar: FNF and HKS are intact bilaterally   I have reviewed labs in epic and the results pertinent to this consultation are: CMP-normal  I have reviewed the images obtained: CT head-large subarachnoid hemorrhage with a comm aneurysm  Impression: 53 year old female with subarachnoid hemorrhage likely from an anterior communicating artery aneurysm.  I have started aggressive blood pressure management, she will need neurosurgical consultation for definitive management.  Recommendations: 1) neurosurgical consultation 2) nicardipine for blood pressure control   This patient is critically ill and at significant risk of neurological worsening, death and care requires constant monitoring of vital signs, hemodynamics,respiratory and cardiac monitoring, neurological assessment, discussion with family, other specialists and medical decision making of high complexity. I spent 35 minutes of neurocritical care time  in the care of  this patient. This was time spent independent of any time provided by nurse practitioner or PA.  Roland Rack, MD [facility] [phone]  If 7pm- 7am, please page neurology on call as listed in [facility]. 05/06/2019  8:46 AM

## 2019-05-06 NOTE — Progress Notes (Signed)
Pt having intermittent nausea x2.  Pt still having constant pain as well.   Dr. Vertell Limber, on call, notified.  Orders given.  Will continue to monitor.

## 2019-05-06 NOTE — Anesthesia Postprocedure Evaluation (Signed)
Anesthesia Post Note  Patient: Carolyn Freeman  Procedure(s) Performed: RADIOLOGY WITH ANESTHESIA (N/A )     Patient location during evaluation: PACU Anesthesia Type: General Level of consciousness: awake and alert Pain management: pain level controlled Vital Signs Assessment: post-procedure vital signs reviewed and stable Respiratory status: spontaneous breathing, nonlabored ventilation, respiratory function stable and patient connected to nasal cannula oxygen Cardiovascular status: blood pressure returned to baseline and stable Postop Assessment: no apparent nausea or vomiting Anesthetic complications: no    Last Vitals:  Vitals:   05/06/19 1355 05/06/19 1410  BP: (!) 110/41 130/78  Pulse: (!) 45 (!) 56  Resp: 18 20  Temp: (!) 36.3 C (!) 36.3 C  SpO2: 97% 96%    Last Pain:  Vitals:   05/06/19 1355  TempSrc:   PainSc: Asleep                 Dmani Mizer DAVID

## 2019-05-06 NOTE — Transfer of Care (Signed)
Immediate Anesthesia Transfer of Care Note  Patient: Carolyn Freeman  Procedure(s) Performed: RADIOLOGY WITH ANESTHESIA (N/A )  Patient Location: PACU  Anesthesia Type:General  Level of Consciousness: drowsy and patient cooperative  Airway & Oxygen Therapy: Patient Spontanous Breathing and Patient connected to face mask oxygen  Post-op Assessment: Report given to RN and Post -op Vital signs reviewed and stable  Post vital signs: Reviewed and stable  Last Vitals:  Vitals Value Taken Time  BP 110/41 05/06/19 1356  Temp 36.3 C 05/06/19 1355  Pulse 47 05/06/19 1402  Resp 17 05/06/19 1402  SpO2 99 % 05/06/19 1402  Vitals shown include unvalidated device data.  Last Pain:  Vitals:   05/06/19 0944  TempSrc:   PainSc: Asleep         Complications: No apparent anesthesia complications

## 2019-05-06 NOTE — Brief Op Note (Signed)
  NEUROSURGERY BRIEF OPERATIVE  NOTE   PREOP DX: Subarachnoid Hemorrhage  POSTOP DX: Same  PROCEDURE: Diagnostic cerebral angiogram, Coiling of Acom aneurysm  SURGEON: Dr. Consuella Lose, MD  ANESTHESIA: GETA  EBL: Minimal  SPECIMENS: None  COMPLICATIONS: None  CONDITION: Stable to recovery  FINDINGS (Full report in [facility]): 1. Successful coil embolization of Acom aneurysm without residual identified on post-coiling angiogram.  2. No other aneurysms, AVMs, or fistulas 3. Previous LICA Pipeline embolization, no supraclinoid aneurysms seen, no in-stent stenosis

## 2019-05-06 NOTE — Sedation Documentation (Signed)
Pt transported to PACU for recovery.  

## 2019-05-06 NOTE — Anesthesia Preprocedure Evaluation (Signed)
Anesthesia Evaluation  Patient identified by MRN, date of birth, ID band Patient awake    Reviewed: Allergy & Precautions, NPO status , Patient's Chart, lab work & pertinent test results  Airway Mallampati: I  TM Distance: >3 FB Neck ROM: Full    Dental   Pulmonary former smoker,    Pulmonary exam normal        Cardiovascular Normal cardiovascular exam     Neuro/Psych Anxiety Depression    GI/Hepatic   Endo/Other    Renal/GU      Musculoskeletal   Abdominal   Peds  Hematology   Anesthesia Other Findings   Reproductive/Obstetrics                             Anesthesia Physical Anesthesia Plan  ASA: III and emergent  Anesthesia Plan: General   Post-op Pain Management:    Induction: Intravenous  PONV Risk Score and Plan: 3 and Midazolam, Ondansetron and Treatment may vary due to age or medical condition  Airway Management Planned: Oral ETT  Additional Equipment: Arterial line  Intra-op Plan:   Post-operative Plan: Post-operative intubation/ventilation  Informed Consent: I have reviewed the patients History and Physical, chart, labs and discussed the procedure including the risks, benefits and alternatives for the proposed anesthesia with the patient or authorized representative who has indicated his/her understanding and acceptance.       Plan Discussed with: CRNA and Surgeon  Anesthesia Plan Comments:         Anesthesia Quick Evaluation

## 2019-05-06 NOTE — Anesthesia Procedure Notes (Signed)
Procedure Name: Intubation Date/Time: 05/06/2019 11:27 AM Performed by: Mariea Clonts, CRNA Pre-anesthesia Checklist: Patient identified, Emergency Drugs available, Suction available and Patient being monitored Patient Re-evaluated:Patient Re-evaluated prior to induction Oxygen Delivery Method: Circle System Utilized Preoxygenation: Pre-oxygenation with 100% oxygen Induction Type: IV induction Ventilation: Mask ventilation without difficulty Laryngoscope Size: Miller and 2 Grade View: Grade I Tube type: Oral Tube size: 7.0 mm Number of attempts: 1 Airway Equipment and Method: Stylet and Oral airway Placement Confirmation: ETT inserted through vocal cords under direct vision,  positive ETCO2 and breath sounds checked- equal and bilateral Tube secured with: Tape Dental Injury: Teeth and Oropharynx as per pre-operative assessment

## 2019-05-06 NOTE — ED Provider Notes (Signed)
[facility] EMERGENCY DEPARTMENT Provider Note   CSN: [id number] Arrival date & time: 05/06/19  [id number]     History No chief complaint on file.   Carolyn Freeman is a 53 y.o. female.  She is brought in by EMS on a code stroke activation.  She is complaining of waking up with a severe headache and vomiting at 6 AM.  EMS also reports some left-sided weakness and some slurred speech.  Patient states that it was the headache that incapacitated her.  Prior history of brain intervention.   The history is provided by the patient and the EMS personnel.  Headache Pain location:  Generalized Quality:  Unable to specify Radiates to:  Does not radiate Severity currently:  10/10 Severity at highest:  10/10 Onset quality:  Sudden Timing:  Constant Progression:  Unchanged Chronicity:  New Relieved by:  None tried Worsened by:  Nothing Ineffective treatments:  None tried Associated symptoms: blurred vision, nausea, neck pain, vomiting and weakness   Associated symptoms: no abdominal pain, no fever and no sore throat        Past Medical History:  Diagnosis Date  . Anxiety   . Depression   . Suicidal behavior     Patient Active Problem List   Diagnosis Date Noted  . OCD (obsessive compulsive disorder) 02/25/2018  . Anxiety state 02/25/2018  . Moderate episode of recurrent major depressive disorder ([place])   . MDD (major depressive disorder), recurrent, severe, with psychosis ([place]) 06/02/2017    Past Surgical History:  Procedure Laterality Date  . annuresym       OB History   No obstetric history on file.     No family history on file.  Social History   Tobacco Use  . Smoking status: Former Research scientist (life sciences)  . Smokeless tobacco: Current User  Substance Use Topics  . Alcohol use: Yes  . Drug use: Yes    Types: Marijuana    Home Medications Prior to Admission medications   Medication Sig Start Date End Date Taking? Authorizing Provider  ALPRAZolam Duanne Moron) 1  MG tablet Take 1 tablet by mouth twice a day as needed for anxiety 03/30/19   Donnal Moat T, PA-C  amLODipine (NORVASC) 2.5 MG tablet Take 1 tablet (2.5 mg total) by mouth daily. For high blood pressure Patient not taking: Reported on 03/09/2018 06/05/17   Money, Lowry Ram, FNP  lisinopril (PRINIVIL,ZESTRIL) 20 MG tablet Take 1 tablet (20 mg total) by mouth daily. For high blood pressure Patient not taking: Reported on 03/09/2018 06/05/17   Money, Lowry Ram, FNP  lurasidone (LATUDA) 40 MG TABS tablet Take 1 tablet (40 mg total) by mouth daily with supper. 01/08/19   Addison Lank, PA-C  metFORMIN (GLUCOPHAGE) 500 MG tablet Take 1 tablet (500 mg total) by mouth 2 (two) times daily with a meal. 1d/wk, 2/d Patient not taking: Reported on 01/08/2019 04/05/18   Comer Locket, PA-C    Allergies    Patient has no known allergies.  Review of Systems   Review of Systems  Unable to perform ROS: Acuity of condition  Constitutional: Negative for fever.  HENT: Negative for sore throat.   Eyes: Positive for blurred vision and visual disturbance.  Respiratory: Negative for shortness of breath.   Cardiovascular: Negative for chest pain.  Gastrointestinal: Positive for nausea and vomiting. Negative for abdominal pain.  Genitourinary: Negative for dysuria.  Musculoskeletal: Positive for neck pain.  Skin: Negative for rash.  Neurological: Positive for weakness and headaches.  Physical Exam Updated Vital Signs BP 120/78   Pulse (!) 47   Temp 98.7 F (37.1 C) (Oral)   Resp 19   Ht 5\' 3"  (1.6 m)   Wt 63.5 kg   SpO2 97%   BMI 24.80 kg/m   Physical Exam Vitals and nursing note reviewed.  Constitutional:      General: She is in acute distress.     Appearance: She is well-developed.  HENT:     Head: Normocephalic and atraumatic.  Eyes:     Conjunctiva/sclera: Conjunctivae normal.  Cardiovascular:     Rate and Rhythm: Normal rate and regular rhythm.     Pulses: Normal pulses.     Heart  sounds: No murmur.  Pulmonary:     Effort: Pulmonary effort is normal. No respiratory distress.     Breath sounds: Normal breath sounds.  Abdominal:     Palpations: Abdomen is soft.     Tenderness: There is no abdominal tenderness.  Musculoskeletal:        General: No deformity or signs of injury. Normal range of motion.     Cervical back: Neck supple.  Skin:    General: Skin is warm and dry.     Capillary Refill: Capillary refill takes less than 2 seconds.  Neurological:     General: No focal deficit present.     Mental Status: She is alert.     ED Results / Procedures / Treatments   Labs (all labs ordered are listed, but only abnormal results are displayed) Labs Reviewed  CBC - Abnormal; Notable for the following components:      Result Value   RBC 5.25 (*)    All other components within normal limits  DIFFERENTIAL - Abnormal; Notable for the following components:   Lymphs Abs 5.8 (*)    All other components within normal limits  COMPREHENSIVE METABOLIC PANEL - Abnormal; Notable for the following components:   Potassium 3.3 (*)    Glucose, Bld 190 (*)    All other components within normal limits  HEMOGLOBIN A1C - Abnormal; Notable for the following components:   Hgb A1c MFr Bld 5.7 (*)    All other components within normal limits  GLUCOSE, CAPILLARY - Abnormal; Notable for the following components:   Glucose-Capillary 170 (*)    All other components within normal limits  CBG MONITORING, ED - Abnormal; Notable for the following components:   Glucose-Capillary 177 (*)    All other components within normal limits  I-STAT CHEM 8, ED - Abnormal; Notable for the following components:   Potassium 3.1 (*)    Glucose, Bld 187 (*)    All other components within normal limits  CBG MONITORING, ED - Abnormal; Notable for the following components:   Glucose-Capillary 185 (*)    All other components within normal limits  RESPIRATORY PANEL BY RT PCR (FLU A&B, COVID)  PROTIME-INR    APTT  HIV ANTIBODY (ROUTINE TESTING W REFLEX)  I-STAT BETA HCG BLOOD, ED ([facility], [facility], [facility] ONLY)    EKG EKG Interpretation  Date/Time:  Sunday May 06 2019 07:53:39 EST Ventricular Rate:  62 PR Interval:    QRS Duration: 120 QT Interval:  443 QTC Calculation: 450 R Axis:   70 Text Interpretation: Sinus or ectopic atrial rhythm Probable left ventricular hypertrophy Anterior ST elevation, probably due to LVH Baseline wander in lead(s) I III aVL No old tracing to compare Confirmed by Aletta Edouard [phone]) on 05/06/2019 8:00:54 AM   Radiology CTA head  filled.  Result Date: 05/06/2019 CLINICAL DATA:  53 year old female with aneurysm rupture pattern of subarachnoid hemorrhage on head CT for altered mental status weakness headache and vomiting. EXAM: CT ANGIOGRAPHY HEAD TECHNIQUE: Multidetector CT imaging of the head was performed using the standard protocol during bolus administration of intravenous contrast. Multiplanar CT image reconstructions and MIPs were obtained to evaluate the vascular anatomy. CONTRAST:  49mL OMNIPAQUE IOHEXOL 350 MG/ML SOLN COMPARISON:  Plain head CT at 0719 hours today. FINDINGS: Posterior circulation: Patent distal vertebral arteries, the right appears dominant and the left functionally terminates in PICA. No distal vertebral stenosis. The right AICA appears dominant. Patent basilar artery without stenosis. The basilar tip appears normal. Fetal type right PCA origin. The left posterior communicating artery is also present. Normal SCA origins. Bilateral PCA branches are within normal limits. Anterior circulation: Highly tortuous distal cervical right ICA without plaque or stenosis. Patent right ICA siphon without plaque or stenosis. Right ophthalmic artery origin appears normal. Normal right posterior communicating artery origin. Normal right ICA terminus. Normal right MCA origin. Right MCA M1 segment is mildly tortuous. The right MCA bifurcation is within normal limits.  There is motion artifact degrading detail of the right MCA branches which appear to remain patent. Negative distal cervical left ICA. The proximal left ICA siphon appears normal. There is a stent in place from the distal cavernous segment through the mid supraclinoid segment. The stent is patent and covers the region of the left ophthalmic artery origin. The left posterior communicating artery origin is distal to the stent and appears normal. Tortuous but otherwise normal left ICA terminus. Normal left MCA and ACA origins. Left MCA M1 segment is mildly tortuous. Left MCA bifurcation appears normal. Left MCA branch detail degraded by motion but the branches appear to remain patent. Left ACA A1 segment appears dominant, and there may be azygos type ACA anatomy (series 12, image 17). At the anterior communicating artery and right A2 there is a saccular 5 millimeter aneurysm (series 7, image 108). The right A1 appears relatively diminutive. Azygos type ACA appearance but otherwise ACA branches appear within normal limits allowing for motion degradation. Venous sinuses: Not evaluated due to early contrast timing. Anatomic variants: Dominant right vertebral artery, the left functionally terminates in PICA. Fetal type right PCA origin. Dominant appearing left A1 and azygos type ACA anatomy. Review of the MIP images confirms the above findings IMPRESSION: 1. Positive for a 5 mm saccular Aneurysm of the Anterior Communicating Artery, which is favored to be the source of acute hemorrhage. 2. Note that these images suggest Azygos type ACA anatomy with a dominant Left A1. 3. Patent distal left ICA siphon stent with no adjacent aneurysm or adverse features. 4. Basilar tip, right ophthalmic artery, and bilateral posterior communicating artery origins appear normal. 5. Mildly motion degraded other circle-of-Willis branches appear to be normal. No large vessel occlusion. Preliminary report of this exam discussed by telephone with  Dr. Roland Rack on 05/06/2019 at 07:58. At 0742 hours. Electronically Signed   By: Genevie Ann M.D.   On: 05/06/2019 08:00   IR Transcath/Emboliz  Result Date: 05/06/2019 PROCEDURE: DIAGNOSTIC CEREBRAL ANGIOGRAM COIL EMBOLIZATION OF ANTERIOR COMMUNICATING ARTERY ANEURYSM HISTORY: The patient is a 53 year old woman presenting to the hospital after waking up with severe headache. Initial CT scan demonstrated diffuse basal subarachnoid hemorrhage and CT angiogram confirmed the presence of an anterior communicating artery aneurysm. She therefore presents for further workup with diagnostic cerebral angiogram and possible aneurysm coiling. ACCESS: The technical  aspects of the procedure as well as its potential risks and benefits were reviewed with the patient. These risks include but are not limited to stroke, intracranial hemorrhage, bleeding, infection, allergic reaction, damage to organs or vital structures, stroke, non-diagnostic procedure, and the catastrophic outcomes of heart attack, coma, and death. With an understanding of these risks, informed consent was obtained and witnessed. The patient was placed in the supine position on the angiography table and the skin of right groin prepped in the usual sterile fashion. The procedure was performed under general anesthesia. A 5-French sheath was introduced in the right common femoral artery using Seldinger technique. MEDICATIONS: HEPARIN: 0 Units total. CONTRAST:  cc, Omnipaque 300 FLUOROSCOPY TIME:  FLUOROSCOPY TIME: See IR records TECHNIQUE: CATHETERS AND WIRES 5-French JB-1 catheter 180 cm 0.035" glidewire 280cm 0.035 "glidewire 6-French NeuronMax guide sheath 6-French Berenstein Select JB-1 catheter 6-French Simmons 2 Select catheter 0.058" CatV guidecatheter 150 cm Marksman microcatheter Synchro 2 standard microwire Excelsior XT-17 microcatheter COILS USED Target XL 360 soft 4 mm x 8 mm Target XL 360 soft 3 mm x 6 mm Target XL 360 soft 2 mm x 4 mm VESSELS  CATHETERIZED Right internal carotid Left internal carotid Left vertebral Right vertebral Right common femoral Right anterior cerebral VESSELS STUDIED Right internal carotid, head Left internal carotid, head Right vertebral Right anterior cerebral artery, microcatheter run Left internal carotid artery, head (during embolization) Left internal carotid artery, head (immediate post-embolization) Left internal carotid artery, head (final control) Right common femoral PROCEDURAL NARRATIVE A 5-Fr JB-1 glide catheter was advanced over a 0.035 glidewire into the aortic arch. The left internal carotid artery was catheterized and cervical / cerebral angiograms taken. After review of images, the exchange length Glidewire was introduced. The glide catheter was then removed. The 5 French sheath was removed and replaced over the wire for an 8 [place] sheath. The Neuron max guide sheath was then introduced with the introducer however coming over the aortic arch, wire access to the internal carotid artery was lost. The introducer was therefore removed and the [facility] 2 select catheter was introduced and reformed over the aortic arch. The left internal carotid artery was then catheterized. The guide sheath was then advanced into the distal left common carotid. The Select catheter was removed and the 058 guide catheter was coaxially introduced over the microcatheter and microwire. The microcatheter was then advanced under roadmap guidance into the anterior cerebral artery. The guide catheter was then tracked over the microcatheter to its final position in the distal cavernous internal carotid. The microcatheter was then removed and the coiling catheter introduced over the microwire. The catheter was then navigated into the aneurysm lumen over the microwire. Initially, the microcatheter was advanced however location within the aneurysm was not clear and a microcatheter run was taken confirming presence in the right, contralateral,  anterior cerebral artery. Microcatheter was pulled back and advanced again over the wire into the lumen of the aneurysm. The wire was then removed. The above coils were then sequentially deployed with progressive exclusion of the aneurysm. Cerebral angiography was performed immediately post embolization. The coiling catheter was then removed and the guide catheter withdrawn into the cervical internal carotid artery. Final control angiography was then performed from the guide catheter. The guide catheter and guide sheath were then synchronously removed without incident. The JB 1 glide catheter was again introduced, and the right internal carotid artery was catheterized. Cerebral angiograms were taken. The catheter was withdrawn and the right vertebral artery was then  catheterized. Cerebral angiograms were taken. The catheter was then removed without incident. FINDINGS: Right internal carotid, head: Injection reveals the presence of a widely patent ICA, M1, and A1 segments and their branches. No aneurysms, AVMs, or high-flow fistulas are seen. Note is made of a hypoplastic right A1, and the presence of a fetal type posterior cerebral artery. The parenchymal and venous phases are normal. The venous sinuses are widely patent. Left internal carotid, head: Injection reveals the presence of a widely patent ICA, A1, and M1 segments and their branches. There is a pipeline device seen extending from the distal cavernous into the supraclinoid left internal carotid artery. No supraclinoid or cavernous aneurysms are identified. There is no in stent stenosis. There is an aneurysm projecting towards the right at the A1 A2 junction, in the setting of an azygos A2. Aneurysm measures 4.6 x 4.1 x 4.5 mm. The aneurysm has a relatively narrow neck measuring approximately 2.2 mm. The parenchymal and venous phases are normal. The venous sinuses are widely patent. Right vertebral: Injection reveals the presence of a widely patent vertebral  artery. This leads to a widely patent basilar artery that terminates in bilateral P1. The basilar apex is normal. No aneurysms, AVMs, or high-flow fistulas are seen. The parenchymal and venous phases are normal. The venous sinuses are widely patent. Right anterior cerebral artery, microcatheter run: Run taken from the microcatheter indicates the tip of the catheter to be in the contralateral, right A1 segment and not within the aneurysm lumen. Left internal carotid artery, head (during embolization): Injection reveals the presence of a widely patent ICA that leads to a patent ACA and MCA. Coil mass within the aneurysm is stable, without coil prolapse or filling defect to suggest thrombus. Left internal carotid artery, head (immediate post-embolization): Injection reveals the presence of a widely patent ICA that leads to a patent ACA and MCA. Coil mass within the aneurysm is stable, without coil prolapse or filling defect to suggest thrombus. No aneurysm filling is seen. Left internal carotid artery, head (final control): Injection reveals the presence of a widely patent ICA that leads to a patent ACA and MCA. No thrombus is visualized. Coil mass is seen within the aneurysm and is in stable position. The parenchymal and venous phases are unremarkable. Right femoral: Normal vessel. No significant atherosclerotic disease. Arterial sheath in adequate position. DISPOSITION: Upon completion of the study, the femoral sheath was removed and hemostasis obtained using a 7-Fr ExoSeal closure device. Good proximal and distal lower extremity pulses were documented upon achievement of hemostasis. The procedure was well tolerated and no early complications were observed. The patient was transferred to the postanesthesia care unit in stable hemodynamic condition. IMPRESSION: 1. 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 2. No other aneurysms,  arteriovenous malformations, or high-flow fistulas are seen. 3. Previous pipeline embolization of the supraclinoid left internal carotid artery, without any aneurysm filling identified. There is no in stent stenosis. The preliminary results of this procedure were shared with the patient's family. Electronically Signed   By: Consuella Lose   On: 05/06/2019 13:53   CT HEAD CODE STROKE WO CONTRAST  Result Date: 05/06/2019 CLINICAL DATA:  Code stroke. 54 year old female with slurred speech, vomiting, and altered mental status. EXAM: CT HEAD WITHOUT CONTRAST TECHNIQUE: Contiguous axial images were obtained from the base of the skull through the vertex without intravenous contrast. COMPARISON:  None. FINDINGS: Brain: Widespread subarachnoid hemorrhage with basilar cistern predominance in a  pattern typical of ruptured intracranial aneurysm. The hemorrhage volume is fairly symmetric in both sylvian fissures and the basilar cisterns. Cisterna magna relatively spared. No intraventricular hemorrhage. Mild ventriculomegaly suspected. No definite parenchymal hematoma. No superimposed acute cortically based infarct. Vascular: Left ICA siphon vascular stent. Skull: No acute osseous abnormality identified. Sinuses/Orbits: Visualized paranasal sinuses and mastoids are clear. Other: Mildly Disconjugate gaze. Otherwise negative orbits. Visualized scalp soft tissues are within normal limits. ASPECTS [facility] Stroke Program Early CT Score) Total score (0-10 with 10 being normal): Not applicable in aneurysmal pattern subarachnoid hemorrhage. IMPRESSION: 1. Widespread subarachnoid hemorrhage compatible with ruptured intracranial aneurysm. There is a left ICA siphon stent in place suggesting prior aneurysm treatment. CTA is pending. 2. Mild ventriculomegaly suspected. No intraventricular hemorrhage. Critical Value/emergent results were discussed by telephone at the time of interpretation on 05/06/2019 at 7:39 am to provider Dr.  Roland Rack who verbally acknowledged these results. Electronically Signed   By: Genevie Ann M.D.   On: 05/06/2019 07:44   IR ANGIO INTRA EXTRACRAN SEL INTERNAL CAROTID UNI R MOD SED  Result Date: 05/06/2019 PROCEDURE: DIAGNOSTIC CEREBRAL ANGIOGRAM COIL EMBOLIZATION OF ANTERIOR COMMUNICATING ARTERY ANEURYSM HISTORY: The patient is a 53 year old woman presenting to the hospital after waking up with severe headache. Initial CT scan demonstrated diffuse basal subarachnoid hemorrhage and CT angiogram confirmed the presence of an anterior communicating artery aneurysm. She therefore presents for further workup with diagnostic cerebral angiogram and possible aneurysm coiling. ACCESS: The technical aspects of the procedure as well as its potential risks and benefits were reviewed with the patient. These risks include but are not limited to stroke, intracranial hemorrhage, bleeding, infection, allergic reaction, damage to organs or vital structures, stroke, non-diagnostic procedure, and the catastrophic outcomes of heart attack, coma, and death. With an understanding of these risks, informed consent was obtained and witnessed. The patient was placed in the supine position on the angiography table and the skin of right groin prepped in the usual sterile fashion. The procedure was performed under general anesthesia. A 5-French sheath was introduced in the right common femoral artery using Seldinger technique. MEDICATIONS: HEPARIN: 0 Units total. CONTRAST:  cc, Omnipaque 300 FLUOROSCOPY TIME:  FLUOROSCOPY TIME: See IR records TECHNIQUE: CATHETERS AND WIRES 5-French JB-1 catheter 180 cm 0.035" glidewire 280cm 0.035 "glidewire 6-French NeuronMax guide sheath 6-French Berenstein Select JB-1 catheter 6-French Simmons 2 Select catheter 0.058" CatV guidecatheter 150 cm Marksman microcatheter Synchro 2 standard microwire Excelsior XT-17 microcatheter COILS USED Target XL 360 soft 4 mm x 8 mm Target XL 360 soft 3 mm x 6 mm  Target XL 360 soft 2 mm x 4 mm VESSELS CATHETERIZED Right internal carotid Left internal carotid Left vertebral Right vertebral Right common femoral Right anterior cerebral VESSELS STUDIED Right internal carotid, head Left internal carotid, head Right vertebral Right anterior cerebral artery, microcatheter run Left internal carotid artery, head (during embolization) Left internal carotid artery, head (immediate post-embolization) Left internal carotid artery, head (final control) Right common femoral PROCEDURAL NARRATIVE A 5-Fr JB-1 glide catheter was advanced over a 0.035 glidewire into the aortic arch. The left internal carotid artery was catheterized and cervical / cerebral angiograms taken. After review of images, the exchange length Glidewire was introduced. The glide catheter was then removed. The 5 French sheath was removed and replaced over the wire for an 8 [place] sheath. The Neuron max guide sheath was then introduced with the introducer however coming over the aortic arch, wire access to the internal carotid artery was lost. The  introducer was therefore removed and the [facility] 2 select catheter was introduced and reformed over the aortic arch. The left internal carotid artery was then catheterized. The guide sheath was then advanced into the distal left common carotid. The Select catheter was removed and the 058 guide catheter was coaxially introduced over the microcatheter and microwire. The microcatheter was then advanced under roadmap guidance into the anterior cerebral artery. The guide catheter was then tracked over the microcatheter to its final position in the distal cavernous internal carotid. The microcatheter was then removed and the coiling catheter introduced over the microwire. The catheter was then navigated into the aneurysm lumen over the microwire. Initially, the microcatheter was advanced however location within the aneurysm was not clear and a microcatheter run was taken confirming  presence in the right, contralateral, anterior cerebral artery. Microcatheter was pulled back and advanced again over the wire into the lumen of the aneurysm. The wire was then removed. The above coils were then sequentially deployed with progressive exclusion of the aneurysm. Cerebral angiography was performed immediately post embolization. The coiling catheter was then removed and the guide catheter withdrawn into the cervical internal carotid artery. Final control angiography was then performed from the guide catheter. The guide catheter and guide sheath were then synchronously removed without incident. The JB 1 glide catheter was again introduced, and the right internal carotid artery was catheterized. Cerebral angiograms were taken. The catheter was withdrawn and the right vertebral artery was then catheterized. Cerebral angiograms were taken. The catheter was then removed without incident. FINDINGS: Right internal carotid, head: Injection reveals the presence of a widely patent ICA, M1, and A1 segments and their branches. No aneurysms, AVMs, or high-flow fistulas are seen. Note is made of a hypoplastic right A1, and the presence of a fetal type posterior cerebral artery. The parenchymal and venous phases are normal. The venous sinuses are widely patent. Left internal carotid, head: Injection reveals the presence of a widely patent ICA, A1, and M1 segments and their branches. There is a pipeline device seen extending from the distal cavernous into the supraclinoid left internal carotid artery. No supraclinoid or cavernous aneurysms are identified. There is no in stent stenosis. There is an aneurysm projecting towards the right at the A1 A2 junction, in the setting of an azygos A2. Aneurysm measures 4.6 x 4.1 x 4.5 mm. The aneurysm has a relatively narrow neck measuring approximately 2.2 mm. The parenchymal and venous phases are normal. The venous sinuses are widely patent. Right vertebral: Injection reveals the  presence of a widely patent vertebral artery. This leads to a widely patent basilar artery that terminates in bilateral P1. The basilar apex is normal. No aneurysms, AVMs, or high-flow fistulas are seen. The parenchymal and venous phases are normal. The venous sinuses are widely patent. Right anterior cerebral artery, microcatheter run: Run taken from the microcatheter indicates the tip of the catheter to be in the contralateral, right A1 segment and not within the aneurysm lumen. Left internal carotid artery, head (during embolization): Injection reveals the presence of a widely patent ICA that leads to a patent ACA and MCA. Coil mass within the aneurysm is stable, without coil prolapse or filling defect to suggest thrombus. Left internal carotid artery, head (immediate post-embolization): Injection reveals the presence of a widely patent ICA that leads to a patent ACA and MCA. Coil mass within the aneurysm is stable, without coil prolapse or filling defect to suggest thrombus. No aneurysm filling is seen. Left internal carotid artery,  head (final control): Injection reveals the presence of a widely patent ICA that leads to a patent ACA and MCA. No thrombus is visualized. Coil mass is seen within the aneurysm and is in stable position. The parenchymal and venous phases are unremarkable. Right femoral: Normal vessel. No significant atherosclerotic disease. Arterial sheath in adequate position. DISPOSITION: Upon completion of the study, the femoral sheath was removed and hemostasis obtained using a 7-Fr ExoSeal closure device. Good proximal and distal lower extremity pulses were documented upon achievement of hemostasis. The procedure was well tolerated and no early complications were observed. The patient was transferred to the postanesthesia care unit in stable hemodynamic condition. IMPRESSION: 1. 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 2. No other aneurysms, arteriovenous malformations, or high-flow fistulas are seen. 3. Previous pipeline embolization of the supraclinoid left internal carotid artery, without any aneurysm filling identified. There is no in stent stenosis. The preliminary results of this procedure were shared with the patient's family. Electronically Signed   By: Consuella Lose   On: 05/06/2019 13:53   IR ANGIO VERTEBRAL SEL VERTEBRAL UNI R MOD SED  Result Date: 05/06/2019 PROCEDURE: DIAGNOSTIC CEREBRAL ANGIOGRAM COIL EMBOLIZATION OF ANTERIOR COMMUNICATING ARTERY ANEURYSM HISTORY: The patient is a 53 year old woman presenting to the hospital after waking up with severe headache. Initial CT scan demonstrated diffuse basal subarachnoid hemorrhage and CT angiogram confirmed the presence of an anterior communicating artery aneurysm. She therefore presents for further workup with diagnostic cerebral angiogram and possible aneurysm coiling. ACCESS: The technical aspects of the procedure as well as its potential risks and benefits were reviewed with the patient. These risks include but are not limited to stroke, intracranial hemorrhage, bleeding, infection, allergic reaction, damage to organs or vital structures, stroke, non-diagnostic procedure, and the catastrophic outcomes of heart attack, coma, and death. With an understanding of these risks, informed consent was obtained and witnessed. The patient was placed in the supine position on the angiography table and the skin of right groin prepped in the usual sterile fashion. The procedure was performed under general anesthesia. A 5-French sheath was introduced in the right common femoral artery using Seldinger technique. MEDICATIONS: HEPARIN: 0 Units total. CONTRAST:  cc, Omnipaque 300 FLUOROSCOPY TIME:  FLUOROSCOPY TIME: See IR records TECHNIQUE: CATHETERS AND WIRES 5-French JB-1 catheter 180 cm 0.035" glidewire 280cm 0.035 "glidewire 6-French NeuronMax guide sheath  6-French Berenstein Select JB-1 catheter 6-French Simmons 2 Select catheter 0.058" CatV guidecatheter 150 cm Marksman microcatheter Synchro 2 standard microwire Excelsior XT-17 microcatheter COILS USED Target XL 360 soft 4 mm x 8 mm Target XL 360 soft 3 mm x 6 mm Target XL 360 soft 2 mm x 4 mm VESSELS CATHETERIZED Right internal carotid Left internal carotid Left vertebral Right vertebral Right common femoral Right anterior cerebral VESSELS STUDIED Right internal carotid, head Left internal carotid, head Right vertebral Right anterior cerebral artery, microcatheter run Left internal carotid artery, head (during embolization) Left internal carotid artery, head (immediate post-embolization) Left internal carotid artery, head (final control) Right common femoral PROCEDURAL NARRATIVE A 5-Fr JB-1 glide catheter was advanced over a 0.035 glidewire into the aortic arch. The left internal carotid artery was catheterized and cervical / cerebral angiograms taken. After review of images, the exchange length Glidewire was introduced. The glide catheter was then removed. The 5 French sheath was removed and replaced over the wire for an 8 [place] sheath. The Neuron max guide sheath was then introduced with the introducer  however coming over the aortic arch, wire access to the internal carotid artery was lost. The introducer was therefore removed and the [facility] 2 select catheter was introduced and reformed over the aortic arch. The left internal carotid artery was then catheterized. The guide sheath was then advanced into the distal left common carotid. The Select catheter was removed and the 058 guide catheter was coaxially introduced over the microcatheter and microwire. The microcatheter was then advanced under roadmap guidance into the anterior cerebral artery. The guide catheter was then tracked over the microcatheter to its final position in the distal cavernous internal carotid. The microcatheter was then removed and the  coiling catheter introduced over the microwire. The catheter was then navigated into the aneurysm lumen over the microwire. Initially, the microcatheter was advanced however location within the aneurysm was not clear and a microcatheter run was taken confirming presence in the right, contralateral, anterior cerebral artery. Microcatheter was pulled back and advanced again over the wire into the lumen of the aneurysm. The wire was then removed. The above coils were then sequentially deployed with progressive exclusion of the aneurysm. Cerebral angiography was performed immediately post embolization. The coiling catheter was then removed and the guide catheter withdrawn into the cervical internal carotid artery. Final control angiography was then performed from the guide catheter. The guide catheter and guide sheath were then synchronously removed without incident. The JB 1 glide catheter was again introduced, and the right internal carotid artery was catheterized. Cerebral angiograms were taken. The catheter was withdrawn and the right vertebral artery was then catheterized. Cerebral angiograms were taken. The catheter was then removed without incident. FINDINGS: Right internal carotid, head: Injection reveals the presence of a widely patent ICA, M1, and A1 segments and their branches. No aneurysms, AVMs, or high-flow fistulas are seen. Note is made of a hypoplastic right A1, and the presence of a fetal type posterior cerebral artery. The parenchymal and venous phases are normal. The venous sinuses are widely patent. Left internal carotid, head: Injection reveals the presence of a widely patent ICA, A1, and M1 segments and their branches. There is a pipeline device seen extending from the distal cavernous into the supraclinoid left internal carotid artery. No supraclinoid or cavernous aneurysms are identified. There is no in stent stenosis. There is an aneurysm projecting towards the right at the A1 A2 junction, in  the setting of an azygos A2. Aneurysm measures 4.6 x 4.1 x 4.5 mm. The aneurysm has a relatively narrow neck measuring approximately 2.2 mm. The parenchymal and venous phases are normal. The venous sinuses are widely patent. Right vertebral: Injection reveals the presence of a widely patent vertebral artery. This leads to a widely patent basilar artery that terminates in bilateral P1. The basilar apex is normal. No aneurysms, AVMs, or high-flow fistulas are seen. The parenchymal and venous phases are normal. The venous sinuses are widely patent. Right anterior cerebral artery, microcatheter run: Run taken from the microcatheter indicates the tip of the catheter to be in the contralateral, right A1 segment and not within the aneurysm lumen. Left internal carotid artery, head (during embolization): Injection reveals the presence of a widely patent ICA that leads to a patent ACA and MCA. Coil mass within the aneurysm is stable, without coil prolapse or filling defect to suggest thrombus. Left internal carotid artery, head (immediate post-embolization): Injection reveals the presence of a widely patent ICA that leads to a patent ACA and MCA. Coil mass within the aneurysm is stable, without coil  prolapse or filling defect to suggest thrombus. No aneurysm filling is seen. Left internal carotid artery, head (final control): Injection reveals the presence of a widely patent ICA that leads to a patent ACA and MCA. No thrombus is visualized. Coil mass is seen within the aneurysm and is in stable position. The parenchymal and venous phases are unremarkable. Right femoral: Normal vessel. No significant atherosclerotic disease. Arterial sheath in adequate position. DISPOSITION: Upon completion of the study, the femoral sheath was removed and hemostasis obtained using a 7-Fr ExoSeal closure device. Good proximal and distal lower extremity pulses were documented upon achievement of hemostasis. The procedure was well tolerated and  no early complications were observed. The patient was transferred to the postanesthesia care unit in stable hemodynamic condition. IMPRESSION: 1. 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 2. No other aneurysms, arteriovenous malformations, or high-flow fistulas are seen. 3. Previous pipeline embolization of the supraclinoid left internal carotid artery, without any aneurysm filling identified. There is no in stent stenosis. The preliminary results of this procedure were shared with the patient's family. Electronically Signed   By: Consuella Lose   On: 05/06/2019 13:53    Procedures .Critical Care Performed by: Hayden Rasmussen, MD Authorized by: Hayden Rasmussen, MD   Critical care provider statement:    Critical care time (minutes):  45   Critical care time was exclusive of:  Separately billable procedures and treating other patients   Critical care was necessary to treat or prevent imminent or life-threatening deterioration of the following conditions:  CNS failure or compromise   Critical care was time spent personally by me on the following activities:  Discussions with consultants, evaluation of patient's response to treatment, examination of patient, ordering and performing treatments and interventions, ordering and review of laboratory studies, ordering and review of radiographic studies, pulse oximetry, re-evaluation of patient's condition, obtaining history from patient or surrogate, review of old charts and development of treatment plan with patient or surrogate   I assumed direction of critical care for this patient from another provider in my specialty: no     (including critical care time)  Medications Ordered in ED Medications  amLODipine (NORVASC) tablet 2.5 mg (2.5 mg Oral Not Given 05/06/19 1043)  lisinopril (ZESTRIL) tablet 20 mg (20 mg Oral Not Given 05/06/19 1044)  ALPRAZolam (XANAX) tablet  0.5 mg (has no administration in time range)   stroke: mapping our early stages of recovery book (has no administration in time range)  acetaminophen (TYLENOL) tablet 650 mg (has no administration in time range)    Or  acetaminophen (TYLENOL) 160 MG/5ML solution 650 mg (has no administration in time range)    Or  acetaminophen (TYLENOL) suppository 650 mg (has no administration in time range)  senna-docusate (Senokot-S) tablet 1 tablet (1 tablet Oral Not Given 05/06/19 1044)  pantoprazole (PROTONIX) injection 40 mg (has no administration in time range)  insulin aspart (novoLOG) injection 0-15 Units (3 Units Subcutaneous Given 05/06/19 1602)  0.9 %  sodium chloride infusion ( Intravenous IV Pump Association 05/06/19 1646)  acetaminophen-codeine (TYLENOL #3) 300-30 MG per tablet 1-2 tablet (has no administration in time range)  morphine 2 MG/ML injection 1-4 mg (2 mg Intravenous Given 05/06/19 1605)  clevidipine (CLEVIPREX) infusion 0.5 mg/mL (8 mg/hr Intravenous New Bag/Given 05/06/19 1646)  niMODipine (NIMOTOP) capsule 60 mg (60 mg Oral Not Given 05/06/19 1043)    Or  niMODipine (NYMALIZE) 6 MG/ML oral solution 60 mg (  Per Tube See Alternative 05/06/19 1043)  sodium chloride flush (NS) 0.9 % injection 3 mL (3 mLs Intravenous Given 05/06/19 0758)  clevidipine (CLEVIPREX) infusion 0.5 mg/mL (16 mg/hr Intravenous Rate/Dose Change 05/06/19 0833)  iohexol (OMNIPAQUE) 350 MG/ML injection 50 mL (50 mLs Intravenous Contrast Given 05/06/19 0749)  morphine 4 MG/ML injection 4 mg (4 mg Intravenous Given 05/06/19 0808)  ondansetron (ZOFRAN) injection 4 mg (4 mg Intravenous Given 05/06/19 0807)  morphine 4 MG/ML injection 4 mg (4 mg Intravenous Given 05/06/19 1003)  iohexol (OMNIPAQUE) 300 MG/ML solution 150 mL (65 mLs Intra-arterial Contrast Given 05/06/19 1323)  clevidipine (CLEVIPREX) 0.5 MG/ML infusion (  Override pull for Anesthesia 05/06/19 1338)    ED Course  I have reviewed the triage vital  signs and the nursing notes.  Pertinent labs & imaging results that were available during my care of the patient were reviewed by me and considered in my medical decision making (see chart for details).  Clinical Course as of May 05 1648  Sun May 06, 2019  0800 Stroke activation by EMS.  Acute onset severe headache.  Patient is awake and alert in pain.  Initial CT showing subarachnoid hemorrhage.  Patient put on Cleviprex for blood pressure control.  CTA done.  Neurosurgery consulted.   [MB]  0803 Discussed with Dr. Vertell Limber from neurosurgery who will evaluate the patient.   [MB]  0830 Patient received some medication for pain and looks more comfortable.   [MB]  0907 Dr. Vertell Limber down to see the patient and said he would be admitting the patient.  Anticipate coiling later this afternoon by IR.   [MB]    Clinical Course User Index [MB] Hayden Rasmussen, MD   MDM Rules/Calculators/A&P                      Final Clinical Impression(s) / ED Diagnoses Final diagnoses:  SAH (subarachnoid hemorrhage) ([place])  Aneurysm [facility])    Rx / DC Orders ED Discharge Orders    None       Hayden Rasmussen, MD 05/06/19 1652

## 2019-05-06 NOTE — Sedation Documentation (Signed)
R groin level 0. Gauze/tegaderm bandage CDI. 3+RDP, D+RPT.

## 2019-05-06 NOTE — Sedation Documentation (Signed)
Spoke with Margo in pt placement. 4N room being cleaned for admission. Will call when bed ready.

## 2019-05-06 NOTE — Sedation Documentation (Signed)
Pt arrived to IR suite.

## 2019-05-06 NOTE — Sedation Documentation (Signed)
Groin and pulses assessed upon arrival to PACU, no change.

## 2019-05-06 NOTE — Sedation Documentation (Signed)
Pt extubated. R groin level 0. Drsg CDI 3+RDP, D+RPT.

## 2019-05-07 ENCOUNTER — Encounter (HOSPITAL_COMMUNITY): Payer: Self-pay

## 2019-05-07 LAB — GLUCOSE, CAPILLARY
Glucose-Capillary: 115 mg/dL — ABNORMAL HIGH (ref 70–99)
Glucose-Capillary: 124 mg/dL — ABNORMAL HIGH (ref 70–99)
Glucose-Capillary: 111 mg/dL — ABNORMAL HIGH (ref 70–99)
Glucose-Capillary: 113 mg/dL — ABNORMAL HIGH (ref 70–99)
Glucose-Capillary: 123 mg/dL — ABNORMAL HIGH (ref 70–99)

## 2019-05-07 MED ORDER — PANTOPRAZOLE SODIUM 40 MG PO TBEC
40.0000 mg | DELAYED_RELEASE_TABLET | Freq: Every day | ORAL | Status: DC
  Administered 2019-05-07 – 2019-05-16 (×10): 40 mg via ORAL
  Filled 2019-05-07 (×10): qty 1

## 2019-05-07 NOTE — Evaluation (Signed)
Physical Therapy Evaluation Patient Details Name: Carolyn Freeman MRN: [id number] DOB: 1965-08-09 Today's Date: 05/07/2019   History of Present Illness  pt is a 53 y/o female with suicidal behaviors, depression, brain tumor, anxiety, admitted for [facility] due to Acom aneurysm, s/p successful coil embolization.  Clinical Impression  Pt is at or close to baseline functioning and should be safe at home with available assist. There are no further acute PT needs.  Will sign off at this time.     Follow Up Recommendations No PT follow up    Equipment Recommendations  None recommended by PT    Recommendations for Other Services       Precautions / Restrictions Precautions Precautions: None      Mobility  Bed Mobility Overal bed mobility: Independent                Transfers Overall transfer level: Independent                  Ambulation/Gait Ambulation/Gait assistance: Independent(in a homelike environment) Social research officer, government (Feet): 300 Feet Assistive device: None Gait Pattern/deviations: Step-through pattern   Gait velocity interpretation: >2.62 ft/sec, indicative of community ambulatory General Gait Details: steady, no deviation with abrupt directional and speed changes  Stairs            Wheelchair Mobility    Modified Rankin (Stroke Patients Only) Modified Rankin (Stroke Patients Only) Pre-Morbid Rankin Score: No symptoms Modified Rankin: Slight disability     Balance Overall balance assessment: Needs assistance Sitting-balance support: No upper extremity supported Sitting balance-Leahy Scale: Normal     Standing balance support: No upper extremity supported Standing balance-Leahy Scale: Good               High level balance activites: Backward walking;Direction changes;Sudden stops;Turns High Level Balance Comments: no deviation             Pertinent Vitals/Pain Pain Assessment: 0-10 Pain Score: 10-Worst pain ever("20") Pain  Location: head Pain Descriptors / Indicators: Aching Pain Intervention(s): Monitored during session;Patient requesting pain meds-RN notified    Home Living Family/patient expects to be discharged to:: Private residence Living Arrangements: Alone Available Help at Discharge: Family;Available 24 hours/day Type of Home: Apartment Home Access: Level entry       Home Equipment: None      Prior Function Level of Independence: Independent               Hand Dominance   Dominant Hand: Right    Extremity/Trunk Assessment   Upper Extremity Assessment Upper Extremity Assessment: Overall WFL for tasks assessed    Lower Extremity Assessment Lower Extremity Assessment: Overall WFL for tasks assessed    Cervical / Trunk Assessment Cervical / Trunk Assessment: Normal  Communication   Communication: No difficulties  Cognition Arousal/Alertness: Awake/alert Behavior During Therapy: WFL for tasks assessed/performed Overall Cognitive Status: Within Functional Limits for tasks assessed                                        General Comments General comments (skin integrity, edema, etc.): vss    Exercises     Assessment/Plan    PT Assessment Patent does not need any further PT services  PT Problem List         PT Treatment Interventions      PT Goals (Current goals can be found in the Care Plan section)  Acute  Rehab PT Goals Patient Stated Goal: get my head to stop hurting PT Goal Formulation: All assessment and education complete, DC therapy    Frequency     Barriers to discharge        Co-evaluation PT/OT/SLP Co-Evaluation/Treatment: Yes Reason for Co-Treatment: For patient/therapist safety           AM-PAC PT "6 Clicks" Mobility  Outcome Measure Help needed turning from your back to your side while in a flat bed without using bedrails?: None Help needed moving from lying on your back to sitting on the side of a flat bed without using  bedrails?: None Help needed moving to and from a bed to a chair (including a wheelchair)?: None Help needed standing up from a chair using your arms (e.g., wheelchair or bedside chair)?: None Help needed to walk in hospital room?: None Help needed climbing 3-5 steps with a railing? : None 6 Click Score: 24    End of Session   Activity Tolerance: Patient tolerated treatment well Patient left: in bed;with call bell/phone within reach Nurse Communication: Mobility status PT Visit Diagnosis: Other abnormalities of gait and mobility (R26.89);Pain Pain - part of body: (headache)    Time: [id number] PT Time Calculation (min) (ACUTE ONLY): 15 min   Charges:   PT Evaluation $PT Eval Low Complexity: 1 Low          05/07/2019  Ginger Carne., PT Acute Rehabilitation Services [phone]  (pager) [phone]  (office)  Tessie Fass Tiffanee Mcnee 05/07/2019, 4:57 PM

## 2019-05-07 NOTE — Progress Notes (Addendum)
Subjective: Patient reports "I'm ok. My head just hurts"  Objective: Vital signs in last 24 hours: Temp:  [97.3 F (36.3 C)-98.7 F (37.1 C)] 98 F (36.7 C) (12/28 0400) Pulse Rate:  [25-104] 51 (12/28 0700) Resp:  [10-30] 18 (12/28 0700) BP: (95-150)/(41-92) 120/67 (12/28 0700) SpO2:  [49 %-100 %] 100 % (12/28 0700) Arterial Line BP: (116-170)/(53-86) 145/71 (12/28 0700) Weight:  [63.5 kg] 63.5 kg (12/27 0802)  Intake/Output from previous day: 12/27 0701 - 12/28 0700 In: 2296.5 [I.V.:2296.5] Out: 2387 [Urine:2375; Emesis/NG output:2; Blood:10] Intake/Output this shift: No intake/output data recorded.  Alert, conversant. PEARL. No drift. MAEW. Reports persistent moderate headache.   Lab Results: Recent Labs    05/06/19 0714 05/06/19 0723  WBC 9.8  --   HGB 14.2 15.0  HCT 44.2 44.0  PLT 300  --    BMET Recent Labs    05/06/19 0714 05/06/19 0723  NA 139 140  K 3.3* 3.1*  CL 106 106  CO2 22  --   GLUCOSE 190* 187*  BUN 15 17  CREATININE 0.94 0.80  CALCIUM 9.2  --     Studies/Results: CTA head filled.  Result Date: 05/06/2019 CLINICAL DATA:  53 year old female with aneurysm rupture pattern of subarachnoid hemorrhage on head CT for altered mental status weakness headache and vomiting. EXAM: CT ANGIOGRAPHY HEAD TECHNIQUE: Multidetector CT imaging of the head was performed using the standard protocol during bolus administration of intravenous contrast. Multiplanar CT image reconstructions and MIPs were obtained to evaluate the vascular anatomy. CONTRAST:  29mL OMNIPAQUE IOHEXOL 350 MG/ML SOLN COMPARISON:  Plain head CT at 0719 hours today. FINDINGS: Posterior circulation: Patent distal vertebral arteries, the right appears dominant and the left functionally terminates in PICA. No distal vertebral stenosis. The right AICA appears dominant. Patent basilar artery without stenosis. The basilar tip appears normal. Fetal type right PCA origin. The left posterior communicating  artery is also present. Normal SCA origins. Bilateral PCA branches are within normal limits. Anterior circulation: Highly tortuous distal cervical right ICA without plaque or stenosis. Patent right ICA siphon without plaque or stenosis. Right ophthalmic artery origin appears normal. Normal right posterior communicating artery origin. Normal right ICA terminus. Normal right MCA origin. Right MCA M1 segment is mildly tortuous. The right MCA bifurcation is within normal limits. There is motion artifact degrading detail of the right MCA branches which appear to remain patent. Negative distal cervical left ICA. The proximal left ICA siphon appears normal. There is a stent in place from the distal cavernous segment through the mid supraclinoid segment. The stent is patent and covers the region of the left ophthalmic artery origin. The left posterior communicating artery origin is distal to the stent and appears normal. Tortuous but otherwise normal left ICA terminus. Normal left MCA and ACA origins. Left MCA M1 segment is mildly tortuous. Left MCA bifurcation appears normal. Left MCA branch detail degraded by motion but the branches appear to remain patent. Left ACA A1 segment appears dominant, and there may be azygos type ACA anatomy (series 12, image 17). At the anterior communicating artery and right A2 there is a saccular 5 millimeter aneurysm (series 7, image 108). The right A1 appears relatively diminutive. Azygos type ACA appearance but otherwise ACA branches appear within normal limits allowing for motion degradation. Venous sinuses: Not evaluated due to early contrast timing. Anatomic variants: Dominant right vertebral artery, the left functionally terminates in PICA. Fetal type right PCA origin. Dominant appearing left A1 and azygos type ACA anatomy.  Review of the MIP images confirms the above findings IMPRESSION: 1. 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 2. Note that these images suggest Azygos type ACA anatomy with a dominant Left A1. 3. Patent distal left ICA siphon stent with no adjacent aneurysm or adverse features. 4. Basilar tip, right ophthalmic artery, and bilateral posterior communicating artery origins appear normal. 5. Mildly motion degraded other circle-of-Willis branches appear to be normal. No large vessel occlusion. Preliminary report of this exam discussed by telephone with Dr. Roland Rack on 05/06/2019 at 07:58. At 0742 hours. Electronically Signed   By: Genevie Ann M.D.   On: 05/06/2019 08:00   IR Transcath/Emboliz  Result Date: 05/06/2019 PROCEDURE: DIAGNOSTIC CEREBRAL ANGIOGRAM COIL EMBOLIZATION OF ANTERIOR COMMUNICATING ARTERY ANEURYSM HISTORY: The patient is a 53 year old woman presenting to the hospital after waking up with severe headache. Initial CT scan demonstrated diffuse basal subarachnoid hemorrhage and CT angiogram confirmed the presence of an anterior communicating artery aneurysm. She therefore presents for further workup with diagnostic cerebral angiogram and possible aneurysm coiling. ACCESS: The technical aspects of the procedure as well as its potential risks and benefits were reviewed with the patient. These risks include but are not limited to stroke, intracranial hemorrhage, bleeding, infection, allergic reaction, damage to organs or vital structures, stroke, non-diagnostic procedure, and the catastrophic outcomes of heart attack, coma, and death. With an understanding of these risks, informed consent was obtained and witnessed. The patient was placed in the supine position on the angiography table and the skin of right groin prepped in the usual sterile fashion. The procedure was performed under general anesthesia. A 5-French sheath was introduced in the right common femoral artery using Seldinger technique. MEDICATIONS: HEPARIN: 0 Units total. CONTRAST:  cc, Omnipaque 300  FLUOROSCOPY TIME:  FLUOROSCOPY TIME: See IR records TECHNIQUE: CATHETERS AND WIRES 5-French JB-1 catheter 180 cm 0.035" glidewire 280cm 0.035 "glidewire 6-French NeuronMax guide sheath 6-French Berenstein Select JB-1 catheter 6-French Simmons 2 Select catheter 0.058" CatV guidecatheter 150 cm Marksman microcatheter Synchro 2 standard microwire Excelsior XT-17 microcatheter COILS USED Target XL 360 soft 4 mm x 8 mm Target XL 360 soft 3 mm x 6 mm Target XL 360 soft 2 mm x 4 mm VESSELS CATHETERIZED Right internal carotid Left internal carotid Left vertebral Right vertebral Right common femoral Right anterior cerebral VESSELS STUDIED Right internal carotid, head Left internal carotid, head Right vertebral Right anterior cerebral artery, microcatheter run Left internal carotid artery, head (during embolization) Left internal carotid artery, head (immediate post-embolization) Left internal carotid artery, head (final control) Right common femoral PROCEDURAL NARRATIVE A 5-Fr JB-1 glide catheter was advanced over a 0.035 glidewire into the aortic arch. The left internal carotid artery was catheterized and cervical / cerebral angiograms taken. After review of images, the exchange length Glidewire was introduced. The glide catheter was then removed. The 5 French sheath was removed and replaced over the wire for an 8 [place] sheath. The Neuron max guide sheath was then introduced with the introducer however coming over the aortic arch, wire access to the internal carotid artery was lost. The introducer was therefore removed and the [facility] 2 select catheter was introduced and reformed over the aortic arch. The left internal carotid artery was then catheterized. The guide sheath was then advanced into the distal left common carotid. The Select catheter was removed and the 058 guide catheter was coaxially introduced over the microcatheter and  microwire. The microcatheter was then advanced under roadmap guidance into the anterior  cerebral artery. The guide catheter was then tracked over the microcatheter to its final position in the distal cavernous internal carotid. The microcatheter was then removed and the coiling catheter introduced over the microwire. The catheter was then navigated into the aneurysm lumen over the microwire. Initially, the microcatheter was advanced however location within the aneurysm was not clear and a microcatheter run was taken confirming presence in the right, contralateral, anterior cerebral artery. Microcatheter was pulled back and advanced again over the wire into the lumen of the aneurysm. The wire was then removed. The above coils were then sequentially deployed with progressive exclusion of the aneurysm. Cerebral angiography was performed immediately post embolization. The coiling catheter was then removed and the guide catheter withdrawn into the cervical internal carotid artery. Final control angiography was then performed from the guide catheter. The guide catheter and guide sheath were then synchronously removed without incident. The JB 1 glide catheter was again introduced, and the right internal carotid artery was catheterized. Cerebral angiograms were taken. The catheter was withdrawn and the right vertebral artery was then catheterized. Cerebral angiograms were taken. The catheter was then removed without incident. FINDINGS: Right internal carotid, head: Injection reveals the presence of a widely patent ICA, M1, and A1 segments and their branches. No aneurysms, AVMs, or high-flow fistulas are seen. Note is made of a hypoplastic right A1, and the presence of a fetal type posterior cerebral artery. The parenchymal and venous phases are normal. The venous sinuses are widely patent. Left internal carotid, head: Injection reveals the presence of a widely patent ICA, A1, and M1 segments and their branches. There is a pipeline device seen extending from the distal cavernous into the supraclinoid left  internal carotid artery. No supraclinoid or cavernous aneurysms are identified. There is no in stent stenosis. There is an aneurysm projecting towards the right at the A1 A2 junction, in the setting of an azygos A2. Aneurysm measures 4.6 x 4.1 x 4.5 mm. The aneurysm has a relatively narrow neck measuring approximately 2.2 mm. The parenchymal and venous phases are normal. The venous sinuses are widely patent. Right vertebral: Injection reveals the presence of a widely patent vertebral artery. This leads to a widely patent basilar artery that terminates in bilateral P1. The basilar apex is normal. No aneurysms, AVMs, or high-flow fistulas are seen. The parenchymal and venous phases are normal. The venous sinuses are widely patent. Right anterior cerebral artery, microcatheter run: Run taken from the microcatheter indicates the tip of the catheter to be in the contralateral, right A1 segment and not within the aneurysm lumen. Left internal carotid artery, head (during embolization): Injection reveals the presence of a widely patent ICA that leads to a patent ACA and MCA. Coil mass within the aneurysm is stable, without coil prolapse or filling defect to suggest thrombus. Left internal carotid artery, head (immediate post-embolization): Injection reveals the presence of a widely patent ICA that leads to a patent ACA and MCA. Coil mass within the aneurysm is stable, without coil prolapse or filling defect to suggest thrombus. No aneurysm filling is seen. Left internal carotid artery, head (final control): Injection reveals the presence of a widely patent ICA that leads to a patent ACA and MCA. No thrombus is visualized. Coil mass is seen within the aneurysm and is in stable position. The parenchymal and venous phases are unremarkable. Right femoral: Normal vessel. No significant atherosclerotic disease. Arterial sheath in adequate  position. DISPOSITION: Upon completion of the study, the femoral sheath was removed and  hemostasis obtained using a 7-Fr ExoSeal closure device. Good proximal and distal lower extremity pulses were documented upon achievement of hemostasis. The procedure was well tolerated and no early complications were observed. The patient was transferred to the postanesthesia care unit in stable hemodynamic condition. IMPRESSION: 1. 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 2. No other aneurysms, arteriovenous malformations, or high-flow fistulas are seen. 3. Previous pipeline embolization of the supraclinoid left internal carotid artery, without any aneurysm filling identified. There is no in stent stenosis. The preliminary results of this procedure were shared with the patient's family. Electronically Signed   By: Consuella Lose   On: 05/06/2019 13:53   CT HEAD CODE STROKE WO CONTRAST  Result Date: 05/06/2019 CLINICAL DATA:  Code stroke. 53 year old female with slurred speech, vomiting, and altered mental status. EXAM: CT HEAD WITHOUT CONTRAST TECHNIQUE: Contiguous axial images were obtained from the base of the skull through the vertex without intravenous contrast. COMPARISON:  None. FINDINGS: Brain: Widespread subarachnoid hemorrhage with basilar cistern predominance in a pattern typical of ruptured intracranial aneurysm. The hemorrhage volume is fairly symmetric in both sylvian fissures and the basilar cisterns. Cisterna magna relatively spared. No intraventricular hemorrhage. Mild ventriculomegaly suspected. No definite parenchymal hematoma. No superimposed acute cortically based infarct. Vascular: Left ICA siphon vascular stent. Skull: No acute osseous abnormality identified. Sinuses/Orbits: Visualized paranasal sinuses and mastoids are clear. Other: Mildly Disconjugate gaze. Otherwise negative orbits. Visualized scalp soft tissues are within normal limits. ASPECTS [facility] Stroke Program Early CT Score) Total score (0-10  with 10 being normal): Not applicable in aneurysmal pattern subarachnoid hemorrhage. IMPRESSION: 1. Widespread subarachnoid hemorrhage compatible with ruptured intracranial aneurysm. There is a left ICA siphon stent in place suggesting prior aneurysm treatment. CTA is pending. 2. Mild ventriculomegaly suspected. No intraventricular hemorrhage. Critical Value/emergent results were discussed by telephone at the time of interpretation on 05/06/2019 at 7:39 am to provider Dr. Roland Rack who verbally acknowledged these results. Electronically Signed   By: Genevie Ann M.D.   On: 05/06/2019 07:44   IR ANGIO INTRA EXTRACRAN SEL INTERNAL CAROTID UNI R MOD SED  Result Date: 05/06/2019 PROCEDURE: DIAGNOSTIC CEREBRAL ANGIOGRAM COIL EMBOLIZATION OF ANTERIOR COMMUNICATING ARTERY ANEURYSM HISTORY: The patient is a 53 year old woman presenting to the hospital after waking up with severe headache. Initial CT scan demonstrated diffuse basal subarachnoid hemorrhage and CT angiogram confirmed the presence of an anterior communicating artery aneurysm. She therefore presents for further workup with diagnostic cerebral angiogram and possible aneurysm coiling. ACCESS: The technical aspects of the procedure as well as its potential risks and benefits were reviewed with the patient. These risks include but are not limited to stroke, intracranial hemorrhage, bleeding, infection, allergic reaction, damage to organs or vital structures, stroke, non-diagnostic procedure, and the catastrophic outcomes of heart attack, coma, and death. With an understanding of these risks, informed consent was obtained and witnessed. The patient was placed in the supine position on the angiography table and the skin of right groin prepped in the usual sterile fashion. The procedure was performed under general anesthesia. A 5-French sheath was introduced in the right common femoral artery using Seldinger technique. MEDICATIONS: HEPARIN: 0 Units total.  CONTRAST:  cc, Omnipaque 300 FLUOROSCOPY TIME:  FLUOROSCOPY TIME: See IR records TECHNIQUE: CATHETERS AND WIRES 5-French JB-1 catheter 180 cm 0.035" glidewire 280cm 0.035 "glidewire 6-French NeuronMax guide sheath 6-French Berenstein Select JB-1  catheter 6-French Simmons 2 Select catheter 0.058" CatV guidecatheter 150 cm Marksman microcatheter Synchro 2 standard microwire Excelsior XT-17 microcatheter COILS USED Target XL 360 soft 4 mm x 8 mm Target XL 360 soft 3 mm x 6 mm Target XL 360 soft 2 mm x 4 mm VESSELS CATHETERIZED Right internal carotid Left internal carotid Left vertebral Right vertebral Right common femoral Right anterior cerebral VESSELS STUDIED Right internal carotid, head Left internal carotid, head Right vertebral Right anterior cerebral artery, microcatheter run Left internal carotid artery, head (during embolization) Left internal carotid artery, head (immediate post-embolization) Left internal carotid artery, head (final control) Right common femoral PROCEDURAL NARRATIVE A 5-Fr JB-1 glide catheter was advanced over a 0.035 glidewire into the aortic arch. The left internal carotid artery was catheterized and cervical / cerebral angiograms taken. After review of images, the exchange length Glidewire was introduced. The glide catheter was then removed. The 5 French sheath was removed and replaced over the wire for an 8 [place] sheath. The Neuron max guide sheath was then introduced with the introducer however coming over the aortic arch, wire access to the internal carotid artery was lost. The introducer was therefore removed and the [facility] 2 select catheter was introduced and reformed over the aortic arch. The left internal carotid artery was then catheterized. The guide sheath was then advanced into the distal left common carotid. The Select catheter was removed and the 058 guide catheter was coaxially introduced over the microcatheter and microwire. The microcatheter was then advanced under  roadmap guidance into the anterior cerebral artery. The guide catheter was then tracked over the microcatheter to its final position in the distal cavernous internal carotid. The microcatheter was then removed and the coiling catheter introduced over the microwire. The catheter was then navigated into the aneurysm lumen over the microwire. Initially, the microcatheter was advanced however location within the aneurysm was not clear and a microcatheter run was taken confirming presence in the right, contralateral, anterior cerebral artery. Microcatheter was pulled back and advanced again over the wire into the lumen of the aneurysm. The wire was then removed. The above coils were then sequentially deployed with progressive exclusion of the aneurysm. Cerebral angiography was performed immediately post embolization. The coiling catheter was then removed and the guide catheter withdrawn into the cervical internal carotid artery. Final control angiography was then performed from the guide catheter. The guide catheter and guide sheath were then synchronously removed without incident. The JB 1 glide catheter was again introduced, and the right internal carotid artery was catheterized. Cerebral angiograms were taken. The catheter was withdrawn and the right vertebral artery was then catheterized. Cerebral angiograms were taken. The catheter was then removed without incident. FINDINGS: Right internal carotid, head: Injection reveals the presence of a widely patent ICA, M1, and A1 segments and their branches. No aneurysms, AVMs, or high-flow fistulas are seen. Note is made of a hypoplastic right A1, and the presence of a fetal type posterior cerebral artery. The parenchymal and venous phases are normal. The venous sinuses are widely patent. Left internal carotid, head: Injection reveals the presence of a widely patent ICA, A1, and M1 segments and their branches. There is a pipeline device seen extending from the distal  cavernous into the supraclinoid left internal carotid artery. No supraclinoid or cavernous aneurysms are identified. There is no in stent stenosis. There is an aneurysm projecting towards the right at the A1 A2 junction, in the setting of an azygos A2. Aneurysm measures 4.6 x 4.1  x 4.5 mm. The aneurysm has a relatively narrow neck measuring approximately 2.2 mm. The parenchymal and venous phases are normal. The venous sinuses are widely patent. Right vertebral: Injection reveals the presence of a widely patent vertebral artery. This leads to a widely patent basilar artery that terminates in bilateral P1. The basilar apex is normal. No aneurysms, AVMs, or high-flow fistulas are seen. The parenchymal and venous phases are normal. The venous sinuses are widely patent. Right anterior cerebral artery, microcatheter run: Run taken from the microcatheter indicates the tip of the catheter to be in the contralateral, right A1 segment and not within the aneurysm lumen. Left internal carotid artery, head (during embolization): Injection reveals the presence of a widely patent ICA that leads to a patent ACA and MCA. Coil mass within the aneurysm is stable, without coil prolapse or filling defect to suggest thrombus. Left internal carotid artery, head (immediate post-embolization): Injection reveals the presence of a widely patent ICA that leads to a patent ACA and MCA. Coil mass within the aneurysm is stable, without coil prolapse or filling defect to suggest thrombus. No aneurysm filling is seen. Left internal carotid artery, head (final control): Injection reveals the presence of a widely patent ICA that leads to a patent ACA and MCA. No thrombus is visualized. Coil mass is seen within the aneurysm and is in stable position. The parenchymal and venous phases are unremarkable. Right femoral: Normal vessel. No significant atherosclerotic disease. Arterial sheath in adequate position. DISPOSITION: Upon completion of the study,  the femoral sheath was removed and hemostasis obtained using a 7-Fr ExoSeal closure device. Good proximal and distal lower extremity pulses were documented upon achievement of hemostasis. The procedure was well tolerated and no early complications were observed. The patient was transferred to the postanesthesia care unit in stable hemodynamic condition. IMPRESSION: 1. 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 2. No other aneurysms, arteriovenous malformations, or high-flow fistulas are seen. 3. Previous pipeline embolization of the supraclinoid left internal carotid artery, without any aneurysm filling identified. There is no in stent stenosis. The preliminary results of this procedure were shared with the patient's family. Electronically Signed   By: Consuella Lose   On: 05/06/2019 13:53   IR ANGIO VERTEBRAL SEL VERTEBRAL UNI R MOD SED  Result Date: 05/06/2019 PROCEDURE: DIAGNOSTIC CEREBRAL ANGIOGRAM COIL EMBOLIZATION OF ANTERIOR COMMUNICATING ARTERY ANEURYSM HISTORY: The patient is a 53 year old woman presenting to the hospital after waking up with severe headache. Initial CT scan demonstrated diffuse basal subarachnoid hemorrhage and CT angiogram confirmed the presence of an anterior communicating artery aneurysm. She therefore presents for further workup with diagnostic cerebral angiogram and possible aneurysm coiling. ACCESS: The technical aspects of the procedure as well as its potential risks and benefits were reviewed with the patient. These risks include but are not limited to stroke, intracranial hemorrhage, bleeding, infection, allergic reaction, damage to organs or vital structures, stroke, non-diagnostic procedure, and the catastrophic outcomes of heart attack, coma, and death. With an understanding of these risks, informed consent was obtained and witnessed. The patient was placed in the supine position on the  angiography table and the skin of right groin prepped in the usual sterile fashion. The procedure was performed under general anesthesia. A 5-French sheath was introduced in the right common femoral artery using Seldinger technique. MEDICATIONS: HEPARIN: 0 Units total. CONTRAST:  cc, Omnipaque 300 FLUOROSCOPY TIME:  FLUOROSCOPY TIME: See IR records TECHNIQUE: CATHETERS AND WIRES 5-French JB-1  catheter 180 cm 0.035" glidewire 280cm 0.035 "glidewire 6-French NeuronMax guide sheath 6-French Berenstein Select JB-1 catheter 6-French Simmons 2 Select catheter 0.058" CatV guidecatheter 150 cm Marksman microcatheter Synchro 2 standard microwire Excelsior XT-17 microcatheter COILS USED Target XL 360 soft 4 mm x 8 mm Target XL 360 soft 3 mm x 6 mm Target XL 360 soft 2 mm x 4 mm VESSELS CATHETERIZED Right internal carotid Left internal carotid Left vertebral Right vertebral Right common femoral Right anterior cerebral VESSELS STUDIED Right internal carotid, head Left internal carotid, head Right vertebral Right anterior cerebral artery, microcatheter run Left internal carotid artery, head (during embolization) Left internal carotid artery, head (immediate post-embolization) Left internal carotid artery, head (final control) Right common femoral PROCEDURAL NARRATIVE A 5-Fr JB-1 glide catheter was advanced over a 0.035 glidewire into the aortic arch. The left internal carotid artery was catheterized and cervical / cerebral angiograms taken. After review of images, the exchange length Glidewire was introduced. The glide catheter was then removed. The 5 French sheath was removed and replaced over the wire for an 8 [place] sheath. The Neuron max guide sheath was then introduced with the introducer however coming over the aortic arch, wire access to the internal carotid artery was lost. The introducer was therefore removed and the [facility] 2 select catheter was introduced and reformed over the aortic arch. The left internal carotid  artery was then catheterized. The guide sheath was then advanced into the distal left common carotid. The Select catheter was removed and the 058 guide catheter was coaxially introduced over the microcatheter and microwire. The microcatheter was then advanced under roadmap guidance into the anterior cerebral artery. The guide catheter was then tracked over the microcatheter to its final position in the distal cavernous internal carotid. The microcatheter was then removed and the coiling catheter introduced over the microwire. The catheter was then navigated into the aneurysm lumen over the microwire. Initially, the microcatheter was advanced however location within the aneurysm was not clear and a microcatheter run was taken confirming presence in the right, contralateral, anterior cerebral artery. Microcatheter was pulled back and advanced again over the wire into the lumen of the aneurysm. The wire was then removed. The above coils were then sequentially deployed with progressive exclusion of the aneurysm. Cerebral angiography was performed immediately post embolization. The coiling catheter was then removed and the guide catheter withdrawn into the cervical internal carotid artery. Final control angiography was then performed from the guide catheter. The guide catheter and guide sheath were then synchronously removed without incident. The JB 1 glide catheter was again introduced, and the right internal carotid artery was catheterized. Cerebral angiograms were taken. The catheter was withdrawn and the right vertebral artery was then catheterized. Cerebral angiograms were taken. The catheter was then removed without incident. FINDINGS: Right internal carotid, head: Injection reveals the presence of a widely patent ICA, M1, and A1 segments and their branches. No aneurysms, AVMs, or high-flow fistulas are seen. Note is made of a hypoplastic right A1, and the presence of a fetal type posterior cerebral artery. The  parenchymal and venous phases are normal. The venous sinuses are widely patent. Left internal carotid, head: Injection reveals the presence of a widely patent ICA, A1, and M1 segments and their branches. There is a pipeline device seen extending from the distal cavernous into the supraclinoid left internal carotid artery. No supraclinoid or cavernous aneurysms are identified. There is no in stent stenosis. There is an aneurysm projecting towards the right at  the A1 A2 junction, in the setting of an azygos A2. Aneurysm measures 4.6 x 4.1 x 4.5 mm. The aneurysm has a relatively narrow neck measuring approximately 2.2 mm. The parenchymal and venous phases are normal. The venous sinuses are widely patent. Right vertebral: Injection reveals the presence of a widely patent vertebral artery. This leads to a widely patent basilar artery that terminates in bilateral P1. The basilar apex is normal. No aneurysms, AVMs, or high-flow fistulas are seen. The parenchymal and venous phases are normal. The venous sinuses are widely patent. Right anterior cerebral artery, microcatheter run: Run taken from the microcatheter indicates the tip of the catheter to be in the contralateral, right A1 segment and not within the aneurysm lumen. Left internal carotid artery, head (during embolization): Injection reveals the presence of a widely patent ICA that leads to a patent ACA and MCA. Coil mass within the aneurysm is stable, without coil prolapse or filling defect to suggest thrombus. Left internal carotid artery, head (immediate post-embolization): Injection reveals the presence of a widely patent ICA that leads to a patent ACA and MCA. Coil mass within the aneurysm is stable, without coil prolapse or filling defect to suggest thrombus. No aneurysm filling is seen. Left internal carotid artery, head (final control): Injection reveals the presence of a widely patent ICA that leads to a patent ACA and MCA. No thrombus is visualized. Coil  mass is seen within the aneurysm and is in stable position. The parenchymal and venous phases are unremarkable. Right femoral: Normal vessel. No significant atherosclerotic disease. Arterial sheath in adequate position. DISPOSITION: Upon completion of the study, the femoral sheath was removed and hemostasis obtained using a 7-Fr ExoSeal closure device. Good proximal and distal lower extremity pulses were documented upon achievement of hemostasis. The procedure was well tolerated and no early complications were observed. The patient was transferred to the postanesthesia care unit in stable hemodynamic condition. IMPRESSION: 1. 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 2. No other aneurysms, arteriovenous malformations, or high-flow fistulas are seen. 3. Previous pipeline embolization of the supraclinoid left internal carotid artery, without any aneurysm filling identified. There is no in stent stenosis. The preliminary results of this procedure were shared with the patient's family. Electronically Signed   By: Consuella Lose   On: 05/06/2019 13:53    Assessment/Plan:   LOS: 1 day  Supportive care continues.   Verdis Prime 05/07/2019, 7:47 AM  Patient is doing well following coiling of A comm aneurysm

## 2019-05-07 NOTE — Evaluation (Signed)
Occupational Therapy Evaluation Patient Details Name: Carolyn Freeman MRN: [id number] DOB: 1965/05/22 Today's Date: 05/07/2019    History of Present Illness Pt is a 53 y/o female with suicidal behaviors, depression, brain tumor, anxiety, admitted for [facility] due to Acom aneurysm, s/p successful coil embolization.   Clinical Impression   Pt PTA: pt living alone and independent. Pt currently with no focal deficits other than pt with headache "20/10" Rn made aware. Pt performing mobility and ADL with no difficulty at this time. Pt with no weakness noted. Pt reports vision is WFLs. Pt does not require continued OT skilled services. OT signing off.      Follow Up Recommendations  No OT follow up    Equipment Recommendations  None recommended by OT    Recommendations for Other Services       Precautions / Restrictions Precautions Precautions: None Restrictions Weight Bearing Restrictions: No      Mobility Bed Mobility Overal bed mobility: Independent                Transfers Overall transfer level: Independent                    Balance Overall balance assessment: Needs assistance Sitting-balance support: No upper extremity supported Sitting balance-Leahy Scale: Normal     Standing balance support: No upper extremity supported Standing balance-Leahy Scale: Good               High level balance activites: Backward walking;Direction changes;Sudden stops;Turns High Level Balance Comments: no deviation           ADL either performed or assessed with clinical judgement   ADL Overall ADL's : At baseline;Modified independent                                             Vision Baseline Vision/History: Wears glasses Wears Glasses: Reading only Patient Visual Report: No change from baseline Vision Assessment?: No apparent visual deficits     Perception     Praxis      Pertinent Vitals/Pain Pain Assessment: 0-10 Pain Score:  10-Worst pain ever("20") Pain Location: head Pain Descriptors / Indicators: Aching Pain Intervention(s): Monitored during session;Limited activity within patient's tolerance     Hand Dominance Right   Extremity/Trunk Assessment Upper Extremity Assessment Upper Extremity Assessment: Overall WFL for tasks assessed   Lower Extremity Assessment Lower Extremity Assessment: Overall WFL for tasks assessed   Cervical / Trunk Assessment Cervical / Trunk Assessment: Normal   Communication Communication Communication: No difficulties   Cognition Arousal/Alertness: Awake/alert Behavior During Therapy: WFL for tasks assessed/performed Overall Cognitive Status: Within Functional Limits for tasks assessed                                     General Comments  VSS    Exercises     Shoulder Instructions      Home Living Family/patient expects to be discharged to:: Private residence Living Arrangements: Alone Available Help at Discharge: Family;Available 24 hours/day Type of Home: Apartment Home Access: Level entry           Bathroom Shower/Tub: Tub/shower unit   Bathroom Toilet: Handicapped height     Home Equipment: None          Prior Functioning/Environment Level of Independence: Independent  OT Problem List:        OT Treatment/Interventions:      OT Goals(Current goals can be found in the care plan section) Acute Rehab OT Goals Patient Stated Goal: get my head to stop hurting OT Goal Formulation: All assessment and education complete, DC therapy  OT Frequency:     Barriers to D/Freeman:            Co-evaluation PT/OT/SLP Co-Evaluation/Treatment: Yes Reason for Co-Treatment: For patient/therapist safety   OT goals addressed during session: ADL's and self-care      AM-PAC OT "6 Clicks" Daily Activity     Outcome Measure Help from another person eating meals?: None Help from another person taking care of personal  grooming?: None Help from another person toileting, which includes using toliet, bedpan, or urinal?: None Help from another person bathing (including washing, rinsing, drying)?: None Help from another person to put on and taking off regular upper body clothing?: None Help from another person to put on and taking off regular lower body clothing?: None 6 Click Score: 24   End of Session Nurse Communication: Mobility status  Activity Tolerance: Patient tolerated treatment well Patient left: in bed;with call bell/phone within reach  OT Visit Diagnosis: Unsteadiness on feet (R26.81);Pain Pain - part of body: (headache)                Time: [id number] OT Time Calculation (min): 16 min Charges:  OT General Charges $OT Visit: 1 Visit OT Evaluation $OT Eval Low Complexity: [place] OTR/L Acute Rehabilitation Services Pager: [phone] Office: [phone]   Carolyn Freeman 05/07/2019, 5:04 PM

## 2019-05-08 ENCOUNTER — Inpatient Hospital Stay (HOSPITAL_COMMUNITY): Payer: BC Managed Care – PPO

## 2019-05-08 DIAGNOSIS — I609 Nontraumatic subarachnoid hemorrhage, unspecified: Secondary | ICD-10-CM

## 2019-05-08 DIAGNOSIS — I729 Aneurysm of unspecified site: Secondary | ICD-10-CM

## 2019-05-08 LAB — CBC
HCT: 36.9 % (ref 36.0–46.0)
Hemoglobin: 12.3 g/dL (ref 12.0–15.0)
MCH: 27 pg (ref 26.0–34.0)
MCHC: 33.3 g/dL (ref 30.0–36.0)
MCV: 80.9 fL (ref 80.0–100.0)
Platelets: 212 10*3/uL (ref 150–400)
RBC: 4.56 MIL/uL (ref 3.87–5.11)
RDW: 15.1 % (ref 11.5–15.5)
WBC: 10.3 10*3/uL (ref 4.0–10.5)
nRBC: 0 % (ref 0.0–0.2)

## 2019-05-08 LAB — RENAL FUNCTION PANEL
Albumin: 3.8 g/dL (ref 3.5–5.0)
Anion gap: 11 (ref 5–15)
BUN: 7 mg/dL (ref 6–20)
CO2: 22 mmol/L (ref 22–32)
Calcium: 9.3 mg/dL (ref 8.9–10.3)
Chloride: 106 mmol/L (ref 98–111)
Creatinine, Ser: 0.57 mg/dL (ref 0.44–1.00)
GFR calc Af Amer: >60 mL/min (ref >60)
GFR calc non Af Amer: >60 mL/min (ref >60)
Glucose, Bld: 129 mg/dL — ABNORMAL HIGH (ref 70–99)
Phosphorus: 2.3 mg/dL — ABNORMAL LOW (ref 2.5–4.6)
Potassium: 3 mmol/L — ABNORMAL LOW (ref 3.5–5.1)
Sodium: 139 mmol/L (ref 135–145)

## 2019-05-08 LAB — GLUCOSE, CAPILLARY
Glucose-Capillary: 143 mg/dL — ABNORMAL HIGH (ref 70–99)
Glucose-Capillary: 132 mg/dL — ABNORMAL HIGH (ref 70–99)

## 2019-05-08 LAB — MAGNESIUM: Magnesium: 1.9 mg/dL (ref 1.7–2.4)

## 2019-05-08 MED ORDER — ACETAMINOPHEN 10 MG/ML IV SOLN
1000.0000 mg | Freq: Once | INTRAVENOUS | Status: AC
  Administered 2019-05-08: 1000 mg via INTRAVENOUS
  Filled 2019-05-08: qty 100

## 2019-05-08 MED ORDER — DEXAMETHASONE SODIUM PHOSPHATE 4 MG/ML IJ SOLN
4.0000 mg | Freq: Three times a day (TID) | INTRAMUSCULAR | Status: AC
  Administered 2019-05-08 – 2019-05-09 (×3): 4 mg via INTRAVENOUS
  Filled 2019-05-08 (×3): qty 1

## 2019-05-08 MED ORDER — MAGNESIUM SULFATE 2 GM/50ML IV SOLN
2.0000 g | Freq: Once | INTRAVENOUS | Status: AC
  Administered 2019-05-08: 2 g via INTRAVENOUS
  Filled 2019-05-08: qty 50

## 2019-05-08 MED ORDER — HYDRALAZINE HCL 20 MG/ML IJ SOLN
20.0000 mg | INTRAMUSCULAR | Status: DC | PRN
  Administered 2019-05-08 – 2019-05-13 (×2): 20 mg via INTRAVENOUS
  Filled 2019-05-08 (×2): qty 1

## 2019-05-08 MED ORDER — DEXAMETHASONE SODIUM PHOSPHATE 4 MG/ML IJ SOLN
4.0000 mg | Freq: Once | INTRAMUSCULAR | Status: AC
  Administered 2019-05-08: 4 mg via INTRAVENOUS
  Filled 2019-05-08: qty 1

## 2019-05-08 MED ORDER — PHENYLEPHRINE HCL-NACL 10-0.9 MG/250ML-% IV SOLN
25.0000 ug/min | INTRAVENOUS | Status: DC
  Administered 2019-05-08: 80 ug/min via INTRAVENOUS
  Administered 2019-05-08: 25 ug/min via INTRAVENOUS
  Administered 2019-05-09: 60 ug/min via INTRAVENOUS
  Administered 2019-05-09: 75 ug/min via INTRAVENOUS
  Administered 2019-05-09: 60 ug/min via INTRAVENOUS
  Administered 2019-05-09: 85 ug/min via INTRAVENOUS
  Administered 2019-05-09: 90 ug/min via INTRAVENOUS
  Administered 2019-05-09 (×2): 70 ug/min via INTRAVENOUS
  Administered 2019-05-09: 13.333 ug/min via INTRAVENOUS
  Administered 2019-05-09 – 2019-05-10 (×3): 80 ug/min via INTRAVENOUS
  Administered 2019-05-10: 50 ug/min via INTRAVENOUS
  Administered 2019-05-10: 70 ug/min via INTRAVENOUS
  Filled 2019-05-08 (×11): qty 250
  Filled 2019-05-08: qty 750
  Filled 2019-05-08 (×2): qty 250

## 2019-05-08 MED ORDER — LACTATED RINGERS IV SOLN: INTRAVENOUS | Status: DC

## 2019-05-08 MED ORDER — SODIUM CHLORIDE 0.9 % IV SOLN
250.0000 mL | INTRAVENOUS | Status: DC
  Administered 2019-05-09: 250 mL via INTRAVENOUS

## 2019-05-08 MED ORDER — FENTANYL CITRATE (PF) 100 MCG/2ML IJ SOLN
12.5000 ug | Freq: Once | INTRAMUSCULAR | Status: AC
  Administered 2019-05-08: 12.5 ug via INTRAVENOUS
  Filled 2019-05-08: qty 2

## 2019-05-08 NOTE — Plan of Care (Signed)
  Problem: Clinical Measurements: Goal: Respiratory complications will improve Outcome: Progressing   Problem: Activity: Goal: Risk for activity intolerance will decrease Outcome: Progressing   Problem: Elimination: Goal: Will not experience complications related to urinary retention Outcome: Progressing   Problem: Skin Integrity: Goal: Risk for impaired skin integrity will decrease Outcome: Progressing   Problem: Education: Goal: Knowledge of disease or condition will improve Outcome: Progressing   Problem: Nutrition: Goal: Adequate nutrition will be maintained Outcome: Not Progressing  Pt has poor appetite  Problem: Coping: Goal: Will verbalize positive feelings about self Outcome: Completed/Met Goal: Will identify appropriate support needs Outcome: Completed/Met

## 2019-05-08 NOTE — Consult Note (Signed)
NAME:  Carolyn Freeman, MRN:  [id number], DOB:  04/02/1966, LOS: 2 ADMISSION DATE:  05/06/2019, CONSULTATION DATE:  05/08/2019 REFERRING MD: Vertell Limber CHIEF COMPLAINT:  Aneurysm   Brief History   53 year old female admitted with subarachnoid hemorrhage secondary to Acom aneurysm status post coiling.  CCM consulted for management assistance.  History of present illness   53 year old female presented to the emergency department on 12/27 complaining of nausea and vomiting and severe headache that occurred upon awakening.  There was associated left-sided weakness.  In the ED CT scan revealed large subarachnoid hemorrhage likely from anterior communicating aneurysm.  Neurosurgical consulted and patient taken for coiling.  On 12/29 she had visual changes/blurry vision of the left eye and associated left-sided facial droop.  She was taken to CT no significant changes.  PCCM consulted for management assistance.  Past Medical History   Past Medical History:  Diagnosis Date  . Anxiety   . Brain tumor ([place])   . Depression   . Suicidal behavior      [place] [facility] Events   12/27 Admitted, coiling of aneurysm   Consults:  Neuro Neurosurgery PCCM   Procedures:  12/27 Coiling of Acom aneurysm  Significant Diagnostic Tests:  12/27 CTA Head 5 mm saccular Aneurysm of the Anterior Communicating Artery, which is favored to be the source of acute hemorrhage.  12/29 CT head  Micro Data:  SARS Coronavirus 2 negative MRSA surveillance negative    Antimicrobials:  NA    Interim history/subjective:  Pt evaluated in 4N 17. Walking back from restroom unassisted with steady gait. C/O severe HA. Improved /p steroids.   Objective   Blood pressure (!) 165/91, pulse (!) 104, temperature 98.3 F (36.8 C), temperature source Oral, resp. rate 13, height 5\' 3"  (1.6 m), weight 63.5 kg, SpO2 100 %.        Intake/Output Summary (Last 24 hours) at 05/08/2019 1551 Last data filed at 05/08/2019  1300 Gross per 24 hour  Intake 2750.02 ml  Output --  Net 2750.02 ml   Filed Weights   05/06/19 0802  Weight: 63.5 kg    Examination: General: Well-developed, well nourished. Ambulating with steady gait from bathroom, appears uncomfortable  HENT: Normocephalic, PERRL. Moist mucus membranes Neck: No JVD. Trachea midline. No thyromegaly, no lymphadenopathy CV: RRR. S1S2. No MRG. +2 distal pulses Lungs: BBS present, clear, FNL, symmetrical ABD: +BS x4. SNT/ND. No masses, guarding or rigidity GU: No Foley EXT: MAE well. No edema Skin: PWD. In tact. No rashes or lesions Neuro: A&Ox3. CN II-XII in tact. No focal deficits. + photophobia  Psych: Appropriate mood, insight and judgment for time and situation     Resolved Hospital Problem list   NA  Assessment & Plan:  SAH Acom aneurysm s/p coiling 12/27 Refractory headache related to [facility] P:  Neuro ICU monitoring Serial neuro exams SBP goal > 160-180, prn peripheral neosynephrine  Strict I/O's, monitoring UOP/ renal function LR 125 ml/hr  nimotop 60mg  q 4 TCDs per NSGY Decadron 4mg  q8 hr Mag 2gm now to help with headache Ofirmev x 1 now Prn morphine/ dilaudid- pain has been refractory to narcotics BMP/ mag and CBC now Hold norvasc and lisinopril   Hyperglycemia - steroid induced, A1c 5.7 P:  SSI q 6hr  Anxiety P:  Prn xanax   Best practice:  Diet: regular  Pain/Anxiety/Delirium protocol (if indicated): prn analgesia VAP protocol (if indicated): not indicated  DVT prophylaxis: SCDs GI prophylaxis: not indicated  Glucose control: start SSI Q6H for now  while on steroids  Mobility: Ad lib  Code Status: FULL  Family Communication: per primary  Disposition: 4NICU   Labs   CBC: Recent Labs  Lab 05/06/19 0714 05/06/19 0723  WBC 9.8  --   NEUTROABS 2.6  --   HGB 14.2 15.0  HCT 44.2 44.0  MCV 84.2  --   PLT 300  --     Basic Metabolic Panel: Recent Labs  Lab 05/06/19 0714 05/06/19 0723  NA 139 140   K 3.3* 3.1*  CL 106 106  CO2 22  --   GLUCOSE 190* 187*  BUN 15 17  CREATININE 0.94 0.80  CALCIUM 9.2  --    GFR: Estimated Creatinine Clearance: 73.8 mL/min (by C-G formula based on SCr of 0.8 mg/dL). Recent Labs  Lab 05/06/19 0714  WBC 9.8    Liver Function Tests: Recent Labs  Lab 05/06/19 0714  AST 20  ALT 15  ALKPHOS 73  BILITOT 0.4  PROT 6.5  ALBUMIN 3.9   No results for input(s): LIPASE, AMYLASE in the last 168 hours. No results for input(s): AMMONIA in the last 168 hours.  ABG    Component Value Date/Time   TCO2 24 05/06/2019 0723     Coagulation Profile: Recent Labs  Lab 05/06/19 0714  INR 0.9    Cardiac Enzymes: No results for input(s): CKTOTAL, CKMB, CKMBINDEX, TROPONINI in the last 168 hours.  HbA1C: Hgb A1c MFr Bld  Date/Time Value Ref Range Status  05/06/2019 07:14 AM 5.7 (H) 4.8 - 5.6 % Final    Comment:    (NOTE) Pre diabetes:          5.7%-6.4% Diabetes:              >6.4% Glycemic control for   <7.0% adults with diabetes   06/03/2017 06:29 AM 5.9 (H) 4.8 - 5.6 % Final    Comment:    (NOTE) Pre diabetes:          5.7%-6.4% Diabetes:              >6.4% Glycemic control for   <7.0% adults with diabetes     CBG: Recent Labs  Lab 05/07/19 0509 05/07/19 0749 05/07/19 1117 05/07/19 1528 05/08/19 0746  GLUCAP 124* 111* 113* 123* 143*    Review of Systems: positives in bold  Gen: Denies fever, chills, weight change, fatigue, night sweats HEENT: Denies blurred vision, double vision, hearing loss, tinnitus, sinus congestion, rhinorrhea, sore throat, neck stiffness, dysphagia PULM: Denies shortness of breath, cough, sputum production, hemoptysis, wheezing CV: Denies chest pain, edema, orthopnea, paroxysmal nocturnal dyspnea, palpitations GI: Denies abdominal pain, nausea, vomiting, diarrhea, hematochezia, melena, constipation, change in bowel habits GU: Denies dysuria, hematuria, polyuria, oliguria, urethral  discharge Endocrine: Denies hot or cold intolerance, polyuria, polyphagia or appetite change Derm: Denies rash, dry skin, scaling or peeling skin change Heme: Denies easy bruising, bleeding, bleeding gums Neuro: Denies headache, numbness, weakness, slurred speech, loss of memory or consciousness   Past Medical History  She,  has a past medical history of Anxiety, Brain tumor ([place]), Depression, and Suicidal behavior.   Surgical History    Past Surgical History:  Procedure Laterality Date  . annuresym    . IR ANGIO INTRA EXTRACRAN SEL INTERNAL CAROTID BILAT MOD SED  05/06/2019  . IR ANGIO VERTEBRAL SEL VERTEBRAL BILAT MOD SED  05/06/2019  . IR ANGIOGRAM FOLLOW UP STUDY  05/06/2019  . IR ANGIOGRAM FOLLOW UP STUDY  05/06/2019  . IR ANGIOGRAM FOLLOW  UP STUDY  05/06/2019  . IR NEURO EACH ADD'L AFTER BASIC UNI RIGHT (MS)  05/06/2019  . IR TRANSCATH/EMBOLIZ  05/06/2019  . RADIOLOGY WITH ANESTHESIA N/A 05/06/2019   Procedure: RADIOLOGY WITH ANESTHESIA;  Surgeon: Consuella Lose, MD;  Location: [place];  Service: Radiology;  Laterality: N/A;     Social History   reports that she has quit smoking. She uses smokeless tobacco. She reports current alcohol use. She reports current drug use. Drug: Marijuana.   Family History   Her family history is not on file.   Allergies No Known Allergies   Home Medications  Prior to Admission medications   Medication Sig Start Date End Date Taking? Authorizing Provider  ALPRAZolam Duanne Moron) 1 MG tablet Take 1 tablet by mouth twice a day as needed for anxiety 03/30/19  Yes Addison Lank, PA-C      The patient is critically ill with SAH requiring strict BP and pain control. She requires ICU for high complexity decision making, titration of high alert medications, frequent neuro checks, titration of oxygen and interpretation of advanced monitoring.    I personally spent 35 minutes providing critical care services including personally reviewing test  results, discussing care with nursing staff/other physicians and completing orders pertaining to this patient.  Time was exclusive to the patient and does not include time spent teaching or in procedures.  Voice recognition software was used in the production of this record.  Errors in interpretation may have been inadvertently missed during review.  Francine Graven, MSN, AGACNP  [facility]

## 2019-05-08 NOTE — Progress Notes (Signed)
Transcranial Doppler  Date POD PCO2 HCT BP  MCA ACA PCA OPHT SIPH VERT Basilar  12/29,rs     Right  Left   62  54   -47  -41   44  35   21  17   21   45   -30  -34              Right  Left                                            Right  Left                                             Right  Left                                             Right  Left                                            Right  Left                                            Right  Left                                        MCA = Middle Cerebral Artery      OPHT = Opthalmic Artery     BASILAR = Basilar Artery   ACA = Anterior Cerebral Artery     SIPH = Carotid Siphon PCA = Posterior Cerebral Artery   VERT = Verterbral Artery                   Normal MCA = 62+\-12 ACA = 50+\-12 PCA = 42+\-23

## 2019-05-08 NOTE — Progress Notes (Signed)
Patient had episode of ptosis and blurred vision leading to repeat head CT, which is stable.  This episode has resolved.  I have consulted CCM to help with BP, pain and medical management.  Plan will be to allow SBP up to 180.  No vasospasm on TCDs.

## 2019-05-08 NOTE — Progress Notes (Addendum)
Subjective: Patient reports "My head hurts"  Objective: Vital signs in last 24 hours: Temp:  [98.1 F (36.7 C)-99.5 F (37.5 C)] 98.9 F (37.2 C) (12/29 0400) Pulse Rate:  [32-68] 47 (12/29 0700) Resp:  [7-23] 23 (12/29 0700) BP: (103-158)/(58-101) 150/86 (12/29 0700) SpO2:  [79 %-100 %] 94 % (12/29 0700) Arterial Line BP: (137-168)/(60-80) 168/80 (12/28 1130)  Intake/Output from previous day: 12/28 0701 - 12/29 0700 In: 2883.8 [I.V.:2883.8] Out: 350 [Urine:350] Intake/Output this shift: No intake/output data recorded.  Alert, conversant. Reports persistent headache relieved by meds "for a few minutes". MAEW. No drift. PEARL. Mobilized yesterday with nursing.  Lab Results: Recent Labs    05/06/19 0714 05/06/19 0723  WBC 9.8  --   HGB 14.2 15.0  HCT 44.2 44.0  PLT 300  --    BMET Recent Labs    05/06/19 0714 05/06/19 0723  NA 139 140  K 3.3* 3.1*  CL 106 106  CO2 22  --   GLUCOSE 190* 187*  BUN 15 17  CREATININE 0.94 0.80  CALCIUM 9.2  --     Studies/Results: CTA head filled.  Result Date: 05/06/2019 CLINICAL DATA:  53 year old female with aneurysm rupture pattern of subarachnoid hemorrhage on head CT for altered mental status weakness headache and vomiting. EXAM: CT ANGIOGRAPHY HEAD TECHNIQUE: Multidetector CT imaging of the head was performed using the standard protocol during bolus administration of intravenous contrast. Multiplanar CT image reconstructions and MIPs were obtained to evaluate the vascular anatomy. CONTRAST:  62mL OMNIPAQUE IOHEXOL 350 MG/ML SOLN COMPARISON:  Plain head CT at 0719 hours today. FINDINGS: Posterior circulation: Patent distal vertebral arteries, the right appears dominant and the left functionally terminates in PICA. No distal vertebral stenosis. The right AICA appears dominant. Patent basilar artery without stenosis. The basilar tip appears normal. Fetal type right PCA origin. The left posterior communicating artery is also present.  Normal SCA origins. Bilateral PCA branches are within normal limits. Anterior circulation: Highly tortuous distal cervical right ICA without plaque or stenosis. Patent right ICA siphon without plaque or stenosis. Right ophthalmic artery origin appears normal. Normal right posterior communicating artery origin. Normal right ICA terminus. Normal right MCA origin. Right MCA M1 segment is mildly tortuous. The right MCA bifurcation is within normal limits. There is motion artifact degrading detail of the right MCA branches which appear to remain patent. Negative distal cervical left ICA. The proximal left ICA siphon appears normal. There is a stent in place from the distal cavernous segment through the mid supraclinoid segment. The stent is patent and covers the region of the left ophthalmic artery origin. The left posterior communicating artery origin is distal to the stent and appears normal. Tortuous but otherwise normal left ICA terminus. Normal left MCA and ACA origins. Left MCA M1 segment is mildly tortuous. Left MCA bifurcation appears normal. Left MCA branch detail degraded by motion but the branches appear to remain patent. Left ACA A1 segment appears dominant, and there may be azygos type ACA anatomy (series 12, image 17). At the anterior communicating artery and right A2 there is a saccular 5 millimeter aneurysm (series 7, image 108). The right A1 appears relatively diminutive. Azygos type ACA appearance but otherwise ACA branches appear within normal limits allowing for motion degradation. Venous sinuses: Not evaluated due to early contrast timing. Anatomic variants: Dominant right vertebral artery, the left functionally terminates in PICA. Fetal type right PCA origin. Dominant appearing left A1 and azygos type ACA anatomy. Review of the MIP images  confirms the above findings IMPRESSION: 1. 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 2. Note that these images suggest Azygos type ACA anatomy with a dominant Left A1. 3. Patent distal left ICA siphon stent with no adjacent aneurysm or adverse features. 4. Basilar tip, right ophthalmic artery, and bilateral posterior communicating artery origins appear normal. 5. Mildly motion degraded other circle-of-Willis branches appear to be normal. No large vessel occlusion. Preliminary report of this exam discussed by telephone with Dr. Roland Rack on 05/06/2019 at 07:58. At 0742 hours. Electronically Signed   By: Genevie Ann M.D.   On: 05/06/2019 08:00   IR Transcath/Emboliz  Result Date: 05/06/2019 PROCEDURE: DIAGNOSTIC CEREBRAL ANGIOGRAM COIL EMBOLIZATION OF ANTERIOR COMMUNICATING ARTERY ANEURYSM HISTORY: The patient is a 53 year old woman presenting to the hospital after waking up with severe headache. Initial CT scan demonstrated diffuse basal subarachnoid hemorrhage and CT angiogram confirmed the presence of an anterior communicating artery aneurysm. She therefore presents for further workup with diagnostic cerebral angiogram and possible aneurysm coiling. ACCESS: The technical aspects of the procedure as well as its potential risks and benefits were reviewed with the patient. These risks include but are not limited to stroke, intracranial hemorrhage, bleeding, infection, allergic reaction, damage to organs or vital structures, stroke, non-diagnostic procedure, and the catastrophic outcomes of heart attack, coma, and death. With an understanding of these risks, informed consent was obtained and witnessed. The patient was placed in the supine position on the angiography table and the skin of right groin prepped in the usual sterile fashion. The procedure was performed under general anesthesia. A 5-French sheath was introduced in the right common femoral artery using Seldinger technique. MEDICATIONS: HEPARIN: 0 Units total. CONTRAST:  cc, Omnipaque 300 FLUOROSCOPY TIME:  FLUOROSCOPY  TIME: See IR records TECHNIQUE: CATHETERS AND WIRES 5-French JB-1 catheter 180 cm 0.035" glidewire 280cm 0.035 "glidewire 6-French NeuronMax guide sheath 6-French Berenstein Select JB-1 catheter 6-French Simmons 2 Select catheter 0.058" CatV guidecatheter 150 cm Marksman microcatheter Synchro 2 standard microwire Excelsior XT-17 microcatheter COILS USED Target XL 360 soft 4 mm x 8 mm Target XL 360 soft 3 mm x 6 mm Target XL 360 soft 2 mm x 4 mm VESSELS CATHETERIZED Right internal carotid Left internal carotid Left vertebral Right vertebral Right common femoral Right anterior cerebral VESSELS STUDIED Right internal carotid, head Left internal carotid, head Right vertebral Right anterior cerebral artery, microcatheter run Left internal carotid artery, head (during embolization) Left internal carotid artery, head (immediate post-embolization) Left internal carotid artery, head (final control) Right common femoral PROCEDURAL NARRATIVE A 5-Fr JB-1 glide catheter was advanced over a 0.035 glidewire into the aortic arch. The left internal carotid artery was catheterized and cervical / cerebral angiograms taken. After review of images, the exchange length Glidewire was introduced. The glide catheter was then removed. The 5 French sheath was removed and replaced over the wire for an 8 [place] sheath. The Neuron max guide sheath was then introduced with the introducer however coming over the aortic arch, wire access to the internal carotid artery was lost. The introducer was therefore removed and the [facility] 2 select catheter was introduced and reformed over the aortic arch. The left internal carotid artery was then catheterized. The guide sheath was then advanced into the distal left common carotid. The Select catheter was removed and the 058 guide catheter was coaxially introduced over the microcatheter and microwire. The microcatheter was then  advanced under roadmap guidance into the anterior cerebral artery. The guide  catheter was then tracked over the microcatheter to its final position in the distal cavernous internal carotid. The microcatheter was then removed and the coiling catheter introduced over the microwire. The catheter was then navigated into the aneurysm lumen over the microwire. Initially, the microcatheter was advanced however location within the aneurysm was not clear and a microcatheter run was taken confirming presence in the right, contralateral, anterior cerebral artery. Microcatheter was pulled back and advanced again over the wire into the lumen of the aneurysm. The wire was then removed. The above coils were then sequentially deployed with progressive exclusion of the aneurysm. Cerebral angiography was performed immediately post embolization. The coiling catheter was then removed and the guide catheter withdrawn into the cervical internal carotid artery. Final control angiography was then performed from the guide catheter. The guide catheter and guide sheath were then synchronously removed without incident. The JB 1 glide catheter was again introduced, and the right internal carotid artery was catheterized. Cerebral angiograms were taken. The catheter was withdrawn and the right vertebral artery was then catheterized. Cerebral angiograms were taken. The catheter was then removed without incident. FINDINGS: Right internal carotid, head: Injection reveals the presence of a widely patent ICA, M1, and A1 segments and their branches. No aneurysms, AVMs, or high-flow fistulas are seen. Note is made of a hypoplastic right A1, and the presence of a fetal type posterior cerebral artery. The parenchymal and venous phases are normal. The venous sinuses are widely patent. Left internal carotid, head: Injection reveals the presence of a widely patent ICA, A1, and M1 segments and their branches. There is a pipeline device seen extending from the distal cavernous into the supraclinoid left internal carotid artery. No  supraclinoid or cavernous aneurysms are identified. There is no in stent stenosis. There is an aneurysm projecting towards the right at the A1 A2 junction, in the setting of an azygos A2. Aneurysm measures 4.6 x 4.1 x 4.5 mm. The aneurysm has a relatively narrow neck measuring approximately 2.2 mm. The parenchymal and venous phases are normal. The venous sinuses are widely patent. Right vertebral: Injection reveals the presence of a widely patent vertebral artery. This leads to a widely patent basilar artery that terminates in bilateral P1. The basilar apex is normal. No aneurysms, AVMs, or high-flow fistulas are seen. The parenchymal and venous phases are normal. The venous sinuses are widely patent. Right anterior cerebral artery, microcatheter run: Run taken from the microcatheter indicates the tip of the catheter to be in the contralateral, right A1 segment and not within the aneurysm lumen. Left internal carotid artery, head (during embolization): Injection reveals the presence of a widely patent ICA that leads to a patent ACA and MCA. Coil mass within the aneurysm is stable, without coil prolapse or filling defect to suggest thrombus. Left internal carotid artery, head (immediate post-embolization): Injection reveals the presence of a widely patent ICA that leads to a patent ACA and MCA. Coil mass within the aneurysm is stable, without coil prolapse or filling defect to suggest thrombus. No aneurysm filling is seen. Left internal carotid artery, head (final control): Injection reveals the presence of a widely patent ICA that leads to a patent ACA and MCA. No thrombus is visualized. Coil mass is seen within the aneurysm and is in stable position. The parenchymal and venous phases are unremarkable. Right femoral: Normal vessel. No significant atherosclerotic disease. Arterial sheath in adequate position. DISPOSITION: Upon completion of  the study, the femoral sheath was removed and hemostasis obtained using a  7-Fr ExoSeal closure device. Good proximal and distal lower extremity pulses were documented upon achievement of hemostasis. The procedure was well tolerated and no early complications were observed. The patient was transferred to the postanesthesia care unit in stable hemodynamic condition. IMPRESSION: 1. 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 2. No other aneurysms, arteriovenous malformations, or high-flow fistulas are seen. 3. Previous pipeline embolization of the supraclinoid left internal carotid artery, without any aneurysm filling identified. There is no in stent stenosis. The preliminary results of this procedure were shared with the patient's family. Electronically Signed   By: Consuella Lose   On: 05/06/2019 13:53   IR Angiogram Follow Up Study  Result Date: 05/07/2019 PROCEDURE: DIAGNOSTIC CEREBRAL ANGIOGRAM  COIL EMBOLIZATION OF ANTERIOR COMMUNICATING ARTERY ANEURYSM  HISTORY: The patient is a 53 year old woman presenting to the hospital after waking up with severe headache. Initial CT scan demonstrated diffuse basal subarachnoid hemorrhage and CT angiogram confirmed the presence of an anterior communicating artery aneurysm. She therefore presents for further workup with diagnostic cerebral angiogram and possible aneurysm coiling.  ACCESS: The technical aspects of the procedure as well as its potential risks and benefits were reviewed with the patient. These risks include but are not limited to stroke, intracranial hemorrhage, bleeding, infection, allergic reaction, damage to organs or vital structures, stroke, non-diagnostic procedure, and the catastrophic outcomes of heart attack, coma, and death. With an understanding of these risks, informed consent was obtained and witnessed. The patient was placed in the supine position on the angiography table and the skin of right groin prepped in the usual sterile  fashion. The procedure was performed under general anesthesia. A 5-French sheath was introduced in the right common femoral artery using Seldinger technique.  MEDICATIONS: HEPARIN: 0 Units total.  CONTRAST:  cc, Omnipaque 300  FLUOROSCOPY TIME:  FLUOROSCOPY TIME: See IR records  TECHNIQUE: CATHETERS AND WIRES 5-French JB-1 catheter  180 cm 0.035" glidewire  280cm 0.035 "glidewire  6-French NeuronMax guide sheath  6-French Berenstein Select JB-1 catheter  6-French Simmons 2 Select catheter  0.058" CatV guidecatheter  150 cm Marksman microcatheter  Synchro 2 standard microwire  Excelsior XT-17 microcatheter  COILS USED Target XL 360 soft 4 mm x 8 mm  Target XL 360 soft 3 mm x 6 mm  Target XL 360 soft 2 mm x 4 mm  VESSELS CATHETERIZED Right internal carotid  Left internal carotid  Left vertebral  Right vertebral  Right common femoral  Right anterior cerebral  VESSELS STUDIED Right internal carotid, head  Left internal carotid, head  Right vertebral  Right anterior cerebral artery, microcatheter run  Left internal carotid artery, head (during embolization)  Left internal carotid artery, head (immediate post-embolization)  Left internal carotid artery, head (final control)  Right common femoral  PROCEDURAL NARRATIVE A 5-Fr JB-1 glide catheter was advanced over a 0.035 glidewire into the aortic arch. The left internal carotid artery was catheterized and cervical / cerebral angiograms taken. After review of images, the exchange length Glidewire was introduced. The glide catheter was then removed. The 5 French sheath was removed and replaced over the wire for an 8 [place] sheath. The Neuron max guide sheath was then introduced with the introducer however coming over the aortic arch, wire access to the internal carotid artery was lost. The introducer was therefore removed and the [facility] 2 select catheter was introduced and reformed  over the aortic arch. The left internal carotid artery was then  catheterized. The guide sheath was then advanced into the distal left common carotid. The Select catheter was removed and the 058 guide catheter was coaxially introduced over the microcatheter and microwire.  The microcatheter was then advanced under roadmap guidance into the anterior cerebral artery. The guide catheter was then tracked over the microcatheter to its final position in the distal cavernous internal carotid. The microcatheter was then removed and the coiling catheter introduced over the microwire. The catheter was then navigated into the aneurysm lumen over the microwire. Initially, the microcatheter was advanced however location within the aneurysm was not clear and a microcatheter run was taken confirming presence in the right, contralateral, anterior cerebral artery. Microcatheter was pulled back and advanced again over the wire into the lumen of the aneurysm. The wire was then removed. The above coils were then sequentially deployed with progressive exclusion of the aneurysm.  Cerebral angiography was performed immediately post embolization. The coiling catheter was then removed and the guide catheter withdrawn into the cervical internal carotid artery. Final control angiography was then performed from the guide catheter.  The guide catheter and guide sheath were then synchronously removed without incident. The JB 1 glide catheter was again introduced, and the right internal carotid artery was catheterized. Cerebral angiograms were taken. The catheter was withdrawn and the right vertebral artery was then catheterized. Cerebral angiograms were taken. The catheter was then removed without incident.  FINDINGS: Right internal carotid, head:  Injection reveals the presence of a widely patent ICA, M1, and A1 segments and their branches. No aneurysms, AVMs, or high-flow fistulas are seen. Note is made of a hypoplastic right A1, and the presence of a fetal type posterior cerebral artery. The  parenchymal and venous phases are normal. The venous sinuses are widely patent.  Left internal carotid, head:  Injection reveals the presence of a widely patent ICA, A1, and M1 segments and their branches. There is a pipeline device seen extending from the distal cavernous into the supraclinoid left internal carotid artery. No supraclinoid or cavernous aneurysms are identified. There is no in stent stenosis. There is an aneurysm projecting towards the right at the A1 A2 junction, in the setting of an azygos A2. Aneurysm measures 4.6 x 4.1 x 4.5 mm. The aneurysm has a relatively narrow neck measuring approximately 2.2 mm. The parenchymal and venous phases are normal. The venous sinuses are widely patent.  Right vertebral:  Injection reveals the presence of a widely patent vertebral artery. This leads to a widely patent basilar artery that terminates in bilateral P1. The basilar apex is normal. No aneurysms, AVMs, or high-flow fistulas are seen. The parenchymal and venous phases are normal. The venous sinuses are widely patent.  Right anterior cerebral artery, microcatheter run:  Run taken from the microcatheter indicates the tip of the catheter to be in the contralateral, right A1 segment and not within the aneurysm lumen.  Left internal carotid artery, head (during embolization):  Injection reveals the presence of a widely patent ICA that leads to a patent ACA and MCA. Coil mass within the aneurysm is stable, without coil prolapse or filling defect to suggest thrombus.  Left internal carotid artery, head (immediate post-embolization):  Injection reveals the presence of a widely patent ICA that leads to a patent ACA and MCA. Coil mass within the aneurysm is stable, without coil prolapse or filling defect to suggest thrombus. No aneurysm filling is seen.  Left internal  carotid artery, head (final control):  Injection reveals the presence of a widely patent ICA that leads to a patent ACA and MCA. No  thrombus is visualized. Coil mass is seen within the aneurysm and is in stable position. The parenchymal and venous phases are unremarkable.  Right femoral:  Normal vessel. No significant atherosclerotic disease. Arterial sheath in adequate position.  DISPOSITION: Upon completion of the study, the femoral sheath was removed and hemostasis obtained using a 7-Fr ExoSeal closure device. Good proximal and distal lower extremity pulses were documented upon achievement of hemostasis. The procedure was well tolerated and no early complications were observed.  The patient was transferred to the postanesthesia care unit in stable hemodynamic condition.  IMPRESSION: 1. 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  2. No other aneurysms, arteriovenous malformations, or high-flow fistulas are seen.  3. Previous pipeline embolization of the supraclinoid left internal carotid artery, without any aneurysm filling identified. There is no in stent stenosis.  The preliminary results of this procedure were shared with the patient's family.   Electronically Signed   By: Consuella Lose   On: 05/06/2019 13:53  IR Angiogram Follow Up Study  Result Date: 05/07/2019 PROCEDURE: DIAGNOSTIC CEREBRAL ANGIOGRAM  COIL EMBOLIZATION OF ANTERIOR COMMUNICATING ARTERY ANEURYSM  HISTORY: The patient is a 53 year old woman presenting to the hospital after waking up with severe headache. Initial CT scan demonstrated diffuse basal subarachnoid hemorrhage and CT angiogram confirmed the presence of an anterior communicating artery aneurysm. She therefore presents for further workup with diagnostic cerebral angiogram and possible aneurysm coiling.  ACCESS: The technical aspects of the procedure as well as its potential risks and benefits were reviewed with the patient. These risks include but are not limited to stroke, intracranial hemorrhage, bleeding,  infection, allergic reaction, damage to organs or vital structures, stroke, non-diagnostic procedure, and the catastrophic outcomes of heart attack, coma, and death. With an understanding of these risks, informed consent was obtained and witnessed. The patient was placed in the supine position on the angiography table and the skin of right groin prepped in the usual sterile fashion. The procedure was performed under general anesthesia. A 5-French sheath was introduced in the right common femoral artery using Seldinger technique.  MEDICATIONS: HEPARIN: 0 Units total.  CONTRAST:  cc, Omnipaque 300  FLUOROSCOPY TIME:  FLUOROSCOPY TIME: See IR records  TECHNIQUE: CATHETERS AND WIRES 5-French JB-1 catheter  180 cm 0.035" glidewire  280cm 0.035 "glidewire  6-French NeuronMax guide sheath  6-French Berenstein Select JB-1 catheter  6-French Simmons 2 Select catheter  0.058" CatV guidecatheter  150 cm Marksman microcatheter  Synchro 2 standard microwire  Excelsior XT-17 microcatheter  COILS USED Target XL 360 soft 4 mm x 8 mm  Target XL 360 soft 3 mm x 6 mm  Target XL 360 soft 2 mm x 4 mm  VESSELS CATHETERIZED Right internal carotid  Left internal carotid  Left vertebral  Right vertebral  Right common femoral  Right anterior cerebral  VESSELS STUDIED Right internal carotid, head  Left internal carotid, head  Right vertebral  Right anterior cerebral artery, microcatheter run  Left internal carotid artery, head (during embolization)  Left internal carotid artery, head (immediate post-embolization)  Left internal carotid artery, head (final control)  Right common femoral  PROCEDURAL NARRATIVE A 5-Fr JB-1 glide catheter was advanced over a 0.035 glidewire into the aortic arch. The left internal carotid artery was catheterized and cervical / cerebral angiograms taken. After  review of images, the exchange length Glidewire was introduced. The glide catheter was then removed. The 5 French sheath was  removed and replaced over the wire for an 8 [place] sheath. The Neuron max guide sheath was then introduced with the introducer however coming over the aortic arch, wire access to the internal carotid artery was lost. The introducer was therefore removed and the [facility] 2 select catheter was introduced and reformed over the aortic arch. The left internal carotid artery was then catheterized. The guide sheath was then advanced into the distal left common carotid. The Select catheter was removed and the 058 guide catheter was coaxially introduced over the microcatheter and microwire.  The microcatheter was then advanced under roadmap guidance into the anterior cerebral artery. The guide catheter was then tracked over the microcatheter to its final position in the distal cavernous internal carotid. The microcatheter was then removed and the coiling catheter introduced over the microwire. The catheter was then navigated into the aneurysm lumen over the microwire. Initially, the microcatheter was advanced however location within the aneurysm was not clear and a microcatheter run was taken confirming presence in the right, contralateral, anterior cerebral artery. Microcatheter was pulled back and advanced again over the wire into the lumen of the aneurysm. The wire was then removed. The above coils were then sequentially deployed with progressive exclusion of the aneurysm.  Cerebral angiography was performed immediately post embolization. The coiling catheter was then removed and the guide catheter withdrawn into the cervical internal carotid artery. Final control angiography was then performed from the guide catheter.  The guide catheter and guide sheath were then synchronously removed without incident. The JB 1 glide catheter was again introduced, and the right internal carotid artery was catheterized. Cerebral angiograms were taken. The catheter was withdrawn and the right vertebral artery was then catheterized.  Cerebral angiograms were taken. The catheter was then removed without incident.  FINDINGS: Right internal carotid, head:  Injection reveals the presence of a widely patent ICA, M1, and A1 segments and their branches. No aneurysms, AVMs, or high-flow fistulas are seen. Note is made of a hypoplastic right A1, and the presence of a fetal type posterior cerebral artery. The parenchymal and venous phases are normal. The venous sinuses are widely patent.  Left internal carotid, head:  Injection reveals the presence of a widely patent ICA, A1, and M1 segments and their branches. There is a pipeline device seen extending from the distal cavernous into the supraclinoid left internal carotid artery. No supraclinoid or cavernous aneurysms are identified. There is no in stent stenosis. There is an aneurysm projecting towards the right at the A1 A2 junction, in the setting of an azygos A2. Aneurysm measures 4.6 x 4.1 x 4.5 mm. The aneurysm has a relatively narrow neck measuring approximately 2.2 mm. The parenchymal and venous phases are normal. The venous sinuses are widely patent.  Right vertebral:  Injection reveals the presence of a widely patent vertebral artery. This leads to a widely patent basilar artery that terminates in bilateral P1. The basilar apex is normal. No aneurysms, AVMs, or high-flow fistulas are seen. The parenchymal and venous phases are normal. The venous sinuses are widely patent.  Right anterior cerebral artery, microcatheter run:  Run taken from the microcatheter indicates the tip of the catheter to be in the contralateral, right A1 segment and not within the aneurysm lumen.  Left internal carotid artery, head (during embolization):  Injection reveals the presence of a widely patent ICA that leads  to a patent ACA and MCA. Coil mass within the aneurysm is stable, without coil prolapse or filling defect to suggest thrombus.  Left internal carotid artery, head (immediate post-embolization):   Injection reveals the presence of a widely patent ICA that leads to a patent ACA and MCA. Coil mass within the aneurysm is stable, without coil prolapse or filling defect to suggest thrombus. No aneurysm filling is seen.  Left internal carotid artery, head (final control):  Injection reveals the presence of a widely patent ICA that leads to a patent ACA and MCA. No thrombus is visualized. Coil mass is seen within the aneurysm and is in stable position. The parenchymal and venous phases are unremarkable.  Right femoral:  Normal vessel. No significant atherosclerotic disease. Arterial sheath in adequate position.  DISPOSITION: Upon completion of the study, the femoral sheath was removed and hemostasis obtained using a 7-Fr ExoSeal closure device. Good proximal and distal lower extremity pulses were documented upon achievement of hemostasis. The procedure was well tolerated and no early complications were observed.  The patient was transferred to the postanesthesia care unit in stable hemodynamic condition.  IMPRESSION: 1. 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  2. No other aneurysms, arteriovenous malformations, or high-flow fistulas are seen.  3. Previous pipeline embolization of the supraclinoid left internal carotid artery, without any aneurysm filling identified. There is no in stent stenosis.  The preliminary results of this procedure were shared with the patient's family.   Electronically Signed   By: Consuella Lose   On: 05/06/2019 13:53  IR Angiogram Follow Up Study  Result Date: 05/07/2019 PROCEDURE: DIAGNOSTIC CEREBRAL ANGIOGRAM  COIL EMBOLIZATION OF ANTERIOR COMMUNICATING ARTERY ANEURYSM  HISTORY: The patient is a 53 year old woman presenting to the hospital after waking up with severe headache. Initial CT scan demonstrated diffuse basal subarachnoid hemorrhage and CT angiogram confirmed the presence of  an anterior communicating artery aneurysm. She therefore presents for further workup with diagnostic cerebral angiogram and possible aneurysm coiling.  ACCESS: The technical aspects of the procedure as well as its potential risks and benefits were reviewed with the patient. These risks include but are not limited to stroke, intracranial hemorrhage, bleeding, infection, allergic reaction, damage to organs or vital structures, stroke, non-diagnostic procedure, and the catastrophic outcomes of heart attack, coma, and death. With an understanding of these risks, informed consent was obtained and witnessed. The patient was placed in the supine position on the angiography table and the skin of right groin prepped in the usual sterile fashion. The procedure was performed under general anesthesia. A 5-French sheath was introduced in the right common femoral artery using Seldinger technique.  MEDICATIONS: HEPARIN: 0 Units total.  CONTRAST:  cc, Omnipaque 300  FLUOROSCOPY TIME:  FLUOROSCOPY TIME: See IR records  TECHNIQUE: CATHETERS AND WIRES 5-French JB-1 catheter  180 cm 0.035" glidewire  280cm 0.035 "glidewire  6-French NeuronMax guide sheath  6-French Berenstein Select JB-1 catheter  6-French Simmons 2 Select catheter  0.058" CatV guidecatheter  150 cm Marksman microcatheter  Synchro 2 standard microwire  Excelsior XT-17 microcatheter  COILS USED Target XL 360 soft 4 mm x 8 mm  Target XL 360 soft 3 mm x 6 mm  Target XL 360 soft 2 mm x 4 mm  VESSELS CATHETERIZED Right internal carotid  Left internal carotid  Left vertebral  Right vertebral  Right common femoral  Right anterior cerebral  VESSELS STUDIED Right internal carotid, head  Left internal  carotid, head  Right vertebral  Right anterior cerebral artery, microcatheter run  Left internal carotid artery, head (during embolization)  Left internal carotid artery, head (immediate post-embolization)  Left internal carotid artery, head (final  control)  Right common femoral  PROCEDURAL NARRATIVE A 5-Fr JB-1 glide catheter was advanced over a 0.035 glidewire into the aortic arch. The left internal carotid artery was catheterized and cervical / cerebral angiograms taken. After review of images, the exchange length Glidewire was introduced. The glide catheter was then removed. The 5 French sheath was removed and replaced over the wire for an 8 [place] sheath. The Neuron max guide sheath was then introduced with the introducer however coming over the aortic arch, wire access to the internal carotid artery was lost. The introducer was therefore removed and the [facility] 2 select catheter was introduced and reformed over the aortic arch. The left internal carotid artery was then catheterized. The guide sheath was then advanced into the distal left common carotid. The Select catheter was removed and the 058 guide catheter was coaxially introduced over the microcatheter and microwire.  The microcatheter was then advanced under roadmap guidance into the anterior cerebral artery. The guide catheter was then tracked over the microcatheter to its final position in the distal cavernous internal carotid. The microcatheter was then removed and the coiling catheter introduced over the microwire. The catheter was then navigated into the aneurysm lumen over the microwire. Initially, the microcatheter was advanced however location within the aneurysm was not clear and a microcatheter run was taken confirming presence in the right, contralateral, anterior cerebral artery. Microcatheter was pulled back and advanced again over the wire into the lumen of the aneurysm. The wire was then removed. The above coils were then sequentially deployed with progressive exclusion of the aneurysm.  Cerebral angiography was performed immediately post embolization. The coiling catheter was then removed and the guide catheter withdrawn into the cervical internal carotid artery. Final control  angiography was then performed from the guide catheter.  The guide catheter and guide sheath were then synchronously removed without incident. The JB 1 glide catheter was again introduced, and the right internal carotid artery was catheterized. Cerebral angiograms were taken. The catheter was withdrawn and the right vertebral artery was then catheterized. Cerebral angiograms were taken. The catheter was then removed without incident.  FINDINGS: Right internal carotid, head:  Injection reveals the presence of a widely patent ICA, M1, and A1 segments and their branches. No aneurysms, AVMs, or high-flow fistulas are seen. Note is made of a hypoplastic right A1, and the presence of a fetal type posterior cerebral artery. The parenchymal and venous phases are normal. The venous sinuses are widely patent.  Left internal carotid, head:  Injection reveals the presence of a widely patent ICA, A1, and M1 segments and their branches. There is a pipeline device seen extending from the distal cavernous into the supraclinoid left internal carotid artery. No supraclinoid or cavernous aneurysms are identified. There is no in stent stenosis. There is an aneurysm projecting towards the right at the A1 A2 junction, in the setting of an azygos A2. Aneurysm measures 4.6 x 4.1 x 4.5 mm. The aneurysm has a relatively narrow neck measuring approximately 2.2 mm. The parenchymal and venous phases are normal. The venous sinuses are widely patent.  Right vertebral:  Injection reveals the presence of a widely patent vertebral artery. This leads to a widely patent basilar artery that terminates in bilateral P1. The basilar apex is normal. No aneurysms,  AVMs, or high-flow fistulas are seen. The parenchymal and venous phases are normal. The venous sinuses are widely patent.  Right anterior cerebral artery, microcatheter run:  Run taken from the microcatheter indicates the tip of the catheter to be in the contralateral, right A1 segment  and not within the aneurysm lumen.  Left internal carotid artery, head (during embolization):  Injection reveals the presence of a widely patent ICA that leads to a patent ACA and MCA. Coil mass within the aneurysm is stable, without coil prolapse or filling defect to suggest thrombus.  Left internal carotid artery, head (immediate post-embolization):  Injection reveals the presence of a widely patent ICA that leads to a patent ACA and MCA. Coil mass within the aneurysm is stable, without coil prolapse or filling defect to suggest thrombus. No aneurysm filling is seen.  Left internal carotid artery, head (final control):  Injection reveals the presence of a widely patent ICA that leads to a patent ACA and MCA. No thrombus is visualized. Coil mass is seen within the aneurysm and is in stable position. The parenchymal and venous phases are unremarkable.  Right femoral:  Normal vessel. No significant atherosclerotic disease. Arterial sheath in adequate position.  DISPOSITION: Upon completion of the study, the femoral sheath was removed and hemostasis obtained using a 7-Fr ExoSeal closure device. Good proximal and distal lower extremity pulses were documented upon achievement of hemostasis. The procedure was well tolerated and no early complications were observed.  The patient was transferred to the postanesthesia care unit in stable hemodynamic condition.  IMPRESSION: 1. 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  2. No other aneurysms, arteriovenous malformations, or high-flow fistulas are seen.  3. Previous pipeline embolization of the supraclinoid left internal carotid artery, without any aneurysm filling identified. There is no in stent stenosis.  The preliminary results of this procedure were shared with the patient's family.   Electronically Signed   By: Consuella Lose   On: 05/06/2019 13:53  IR NEURO EACH ADD'L  AFTER BASIC UNI RIGHT (MS)  Result Date: 05/07/2019 PROCEDURE: DIAGNOSTIC CEREBRAL ANGIOGRAM  COIL EMBOLIZATION OF ANTERIOR COMMUNICATING ARTERY ANEURYSM  HISTORY: The patient is a 53 year old woman presenting to the hospital after waking up with severe headache. Initial CT scan demonstrated diffuse basal subarachnoid hemorrhage and CT angiogram confirmed the presence of an anterior communicating artery aneurysm. She therefore presents for further workup with diagnostic cerebral angiogram and possible aneurysm coiling.  ACCESS: The technical aspects of the procedure as well as its potential risks and benefits were reviewed with the patient. These risks include but are not limited to stroke, intracranial hemorrhage, bleeding, infection, allergic reaction, damage to organs or vital structures, stroke, non-diagnostic procedure, and the catastrophic outcomes of heart attack, coma, and death. With an understanding of these risks, informed consent was obtained and witnessed. The patient was placed in the supine position on the angiography table and the skin of right groin prepped in the usual sterile fashion. The procedure was performed under general anesthesia. A 5-French sheath was introduced in the right common femoral artery using Seldinger technique.  MEDICATIONS: HEPARIN: 0 Units total.  CONTRAST:  cc, Omnipaque 300  FLUOROSCOPY TIME:  FLUOROSCOPY TIME: See IR records  TECHNIQUE: CATHETERS AND WIRES 5-French JB-1 catheter  180 cm 0.035" glidewire  280cm 0.035 "glidewire  6-French NeuronMax guide sheath  6-French Berenstein Select JB-1 catheter  6-French Simmons 2 Select catheter  0.058" CatV guidecatheter  150 cm Marksman  microcatheter  Synchro 2 standard microwire  Excelsior XT-17 microcatheter  COILS USED Target XL 360 soft 4 mm x 8 mm  Target XL 360 soft 3 mm x 6 mm  Target XL 360 soft 2 mm x 4 mm  VESSELS CATHETERIZED Right internal carotid  Left internal carotid  Left vertebral  Right  vertebral  Right common femoral  Right anterior cerebral  VESSELS STUDIED Right internal carotid, head  Left internal carotid, head  Right vertebral  Right anterior cerebral artery, microcatheter run  Left internal carotid artery, head (during embolization)  Left internal carotid artery, head (immediate post-embolization)  Left internal carotid artery, head (final control)  Right common femoral  PROCEDURAL NARRATIVE A 5-Fr JB-1 glide catheter was advanced over a 0.035 glidewire into the aortic arch. The left internal carotid artery was catheterized and cervical / cerebral angiograms taken. After review of images, the exchange length Glidewire was introduced. The glide catheter was then removed. The 5 French sheath was removed and replaced over the wire for an 8 [place] sheath. The Neuron max guide sheath was then introduced with the introducer however coming over the aortic arch, wire access to the internal carotid artery was lost. The introducer was therefore removed and the [facility] 2 select catheter was introduced and reformed over the aortic arch. The left internal carotid artery was then catheterized. The guide sheath was then advanced into the distal left common carotid. The Select catheter was removed and the 058 guide catheter was coaxially introduced over the microcatheter and microwire.  The microcatheter was then advanced under roadmap guidance into the anterior cerebral artery. The guide catheter was then tracked over the microcatheter to its final position in the distal cavernous internal carotid. The microcatheter was then removed and the coiling catheter introduced over the microwire. The catheter was then navigated into the aneurysm lumen over the microwire. Initially, the microcatheter was advanced however location within the aneurysm was not clear and a microcatheter run was taken confirming presence in the right, contralateral, anterior cerebral artery. Microcatheter was pulled back and  advanced again over the wire into the lumen of the aneurysm. The wire was then removed. The above coils were then sequentially deployed with progressive exclusion of the aneurysm.  Cerebral angiography was performed immediately post embolization. The coiling catheter was then removed and the guide catheter withdrawn into the cervical internal carotid artery. Final control angiography was then performed from the guide catheter.  The guide catheter and guide sheath were then synchronously removed without incident. The JB 1 glide catheter was again introduced, and the right internal carotid artery was catheterized. Cerebral angiograms were taken. The catheter was withdrawn and the right vertebral artery was then catheterized. Cerebral angiograms were taken. The catheter was then removed without incident.  FINDINGS: Right internal carotid, head:  Injection reveals the presence of a widely patent ICA, M1, and A1 segments and their branches. No aneurysms, AVMs, or high-flow fistulas are seen. Note is made of a hypoplastic right A1, and the presence of a fetal type posterior cerebral artery. The parenchymal and venous phases are normal. The venous sinuses are widely patent.  Left internal carotid, head:  Injection reveals the presence of a widely patent ICA, A1, and M1 segments and their branches. There is a pipeline device seen extending from the distal cavernous into the supraclinoid left internal carotid artery. No supraclinoid or cavernous aneurysms are identified. There is no in stent stenosis. There is an aneurysm projecting towards the right at the  A1 A2 junction, in the setting of an azygos A2. Aneurysm measures 4.6 x 4.1 x 4.5 mm. The aneurysm has a relatively narrow neck measuring approximately 2.2 mm. The parenchymal and venous phases are normal. The venous sinuses are widely patent.  Right vertebral:  Injection reveals the presence of a widely patent vertebral artery. This leads to a widely patent  basilar artery that terminates in bilateral P1. The basilar apex is normal. No aneurysms, AVMs, or high-flow fistulas are seen. The parenchymal and venous phases are normal. The venous sinuses are widely patent.  Right anterior cerebral artery, microcatheter run:  Run taken from the microcatheter indicates the tip of the catheter to be in the contralateral, right A1 segment and not within the aneurysm lumen.  Left internal carotid artery, head (during embolization):  Injection reveals the presence of a widely patent ICA that leads to a patent ACA and MCA. Coil mass within the aneurysm is stable, without coil prolapse or filling defect to suggest thrombus.  Left internal carotid artery, head (immediate post-embolization):  Injection reveals the presence of a widely patent ICA that leads to a patent ACA and MCA. Coil mass within the aneurysm is stable, without coil prolapse or filling defect to suggest thrombus. No aneurysm filling is seen.  Left internal carotid artery, head (final control):  Injection reveals the presence of a widely patent ICA that leads to a patent ACA and MCA. No thrombus is visualized. Coil mass is seen within the aneurysm and is in stable position. The parenchymal and venous phases are unremarkable.  Right femoral:  Normal vessel. No significant atherosclerotic disease. Arterial sheath in adequate position.  DISPOSITION: Upon completion of the study, the femoral sheath was removed and hemostasis obtained using a 7-Fr ExoSeal closure device. Good proximal and distal lower extremity pulses were documented upon achievement of hemostasis. The procedure was well tolerated and no early complications were observed.  The patient was transferred to the postanesthesia care unit in stable hemodynamic condition.  IMPRESSION: 1. 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  2. No other aneurysms, arteriovenous  malformations, or high-flow fistulas are seen.  3. Previous pipeline embolization of the supraclinoid left internal carotid artery, without any aneurysm filling identified. There is no in stent stenosis.  The preliminary results of this procedure were shared with the patient's family.   Electronically Signed   By: Consuella Lose   On: 05/06/2019 13:53   Assessment/Plan: improving  LOS: 2 days  TCD's today per Dr. Vertell Limber. Order entered. Mobilize as tolerated. Supportive care continues.    Verdis Prime 05/08/2019, 7:38 AM   I will try some decadron for patient's persistent complaint of headache refractory to narcotics.  TCDs today.

## 2019-05-08 NOTE — Progress Notes (Signed)
Assisting pt to the restroom at 1445 pt reported having vision changes/blurry vision of the left eye. This RN noted left sided facial droop. Called MD Erline Levine to notify. Pt transported to stat head CT with no complications.

## 2019-05-09 ENCOUNTER — Inpatient Hospital Stay (HOSPITAL_COMMUNITY): Payer: BC Managed Care – PPO

## 2019-05-09 DIAGNOSIS — I609 Nontraumatic subarachnoid hemorrhage, unspecified: Secondary | ICD-10-CM

## 2019-05-09 LAB — GLUCOSE, CAPILLARY
Glucose-Capillary: 133 mg/dL — ABNORMAL HIGH (ref 70–99)
Glucose-Capillary: 108 mg/dL — ABNORMAL HIGH (ref 70–99)
Glucose-Capillary: 111 mg/dL — ABNORMAL HIGH (ref 70–99)

## 2019-05-09 MED ORDER — GABAPENTIN 600 MG PO TABS
300.0000 mg | ORAL_TABLET | Freq: Three times a day (TID) | ORAL | Status: DC
  Filled 2019-05-09: qty 0.5

## 2019-05-09 MED ORDER — BUTALBITAL-APAP-CAFFEINE 50-325-40 MG PO TABS
1.0000 | ORAL_TABLET | ORAL | Status: DC | PRN
  Administered 2019-05-09 – 2019-05-16 (×22): 1 via ORAL
  Filled 2019-05-09 (×22): qty 1

## 2019-05-09 MED ORDER — CLONAZEPAM 0.5 MG PO TABS
0.5000 mg | ORAL_TABLET | Freq: Two times a day (BID) | ORAL | Status: DC
  Administered 2019-05-09 (×2): 0.5 mg via ORAL
  Filled 2019-05-09 (×2): qty 1

## 2019-05-09 MED ORDER — LEVETIRACETAM 500 MG PO TABS
500.0000 mg | ORAL_TABLET | Freq: Two times a day (BID) | ORAL | Status: DC
  Administered 2019-05-09 – 2019-05-17 (×17): 500 mg via ORAL
  Filled 2019-05-09 (×17): qty 1

## 2019-05-09 MED ORDER — POTASSIUM CHLORIDE 20 MEQ/15ML (10%) PO SOLN
40.0000 meq | ORAL | Status: AC
  Administered 2019-05-09 (×2): 40 meq via ORAL
  Filled 2019-05-09 (×2): qty 30

## 2019-05-09 MED ORDER — MAGNESIUM SULFATE 2 GM/50ML IV SOLN
2.0000 g | Freq: Once | INTRAVENOUS | Status: AC
  Administered 2019-05-09: 2 g via INTRAVENOUS
  Filled 2019-05-09: qty 50

## 2019-05-09 MED ORDER — GABAPENTIN 300 MG PO CAPS
300.0000 mg | ORAL_CAPSULE | Freq: Three times a day (TID) | ORAL | Status: DC
  Administered 2019-05-09 – 2019-05-17 (×25): 300 mg via ORAL
  Filled 2019-05-09 (×25): qty 1

## 2019-05-09 MED ORDER — HYDROMORPHONE HCL 1 MG/ML IJ SOLN
1.0000 mg | INTRAMUSCULAR | Status: DC | PRN
  Administered 2019-05-10 – 2019-05-16 (×12): 1 mg via INTRAVENOUS
  Filled 2019-05-09 (×13): qty 1

## 2019-05-09 MED ORDER — ACETAMINOPHEN 325 MG PO TABS
650.0000 mg | ORAL_TABLET | Freq: Four times a day (QID) | ORAL | Status: DC
  Administered 2019-05-09 – 2019-05-17 (×30): 650 mg via ORAL
  Filled 2019-05-09 (×32): qty 2

## 2019-05-09 MED ORDER — OXYCODONE HCL 5 MG PO TABS
10.0000 mg | ORAL_TABLET | ORAL | Status: DC | PRN
  Administered 2019-05-10 – 2019-05-17 (×30): 10 mg via ORAL
  Filled 2019-05-09 (×31): qty 2

## 2019-05-09 NOTE — Progress Notes (Signed)
Transcranial Doppler  Date POD PCO2 HCT BP  MCA ACA PCA OPHT SIPH VERT Basilar  12/29,rs     Right  Left   62  54   -47  -41   44  35   21  17   21   45   -30  -34   -62      12/30,rs     Right  Left   43  56   -41  -55   34  32   20  13   65  60   -25  -37     -45         Right  Left                                             Right  Left                                             Right  Left                                            Right  Left                                            Right  Left                                        MCA = Middle Cerebral Artery      OPHT = Opthalmic Artery     BASILAR = Basilar Artery   ACA = Anterior Cerebral Artery     SIPH = Carotid Siphon PCA = Posterior Cerebral Artery   VERT = Verterbral Artery                   Normal MCA = 62+\-12 ACA = 50+\-12 PCA = 42+\-23

## 2019-05-09 NOTE — Progress Notes (Signed)
  NEUROSURGERY PROGRESS NOTE   No issues overnight.  No further episodes of ptosis/blurry vision Complains of fairly severe HA, not improved with multiple medications No changes in vision, N/T, weakness  EXAM:  BP (!) 180/114   Pulse (!) 50   Temp 98.8 F (37.1 C) (Oral)   Resp 18   Ht 5\' 3"  (1.6 m)   Wt 63.5 kg   SpO2 95%   BMI 24.80 kg/m   Awake, alert, oriented  Speech fluent, appropriate  CN grossly intact  5/5 BUE/BLE  No drift  IMPRESSION/PLAN 53 y.o. female pod #3 s/p coil ACOM aneurysm. Stable neurologically. No further episodes of ptosis/blurry vision. - continue supportive care, nimotop - SBP up to 180 - will add Keppra due to transient neuro symptoms yesterday for seizure prophylaxis - PT/OT - Appreciate PCCM assistance

## 2019-05-09 NOTE — Consult Note (Addendum)
   NAME:  Carolyn Freeman, MRN:  [id number], DOB:  [birth year], LOS: 3 ADMISSION DATE:  05/06/2019, CONSULTATION DATE:  05/08/2019 REFERRING MD: Vertell Limber CHIEF COMPLAINT:  Aneurysm   Brief History   53 year old female admitted with subarachnoid hemorrhage secondary to Acom aneurysm status post coiling.  CCM consulted for management assistance.  History of present illness   53 year old female presented to the emergency department on 12/27 complaining of nausea and vomiting and severe headache that occurred upon awakening.  There was associated left-sided weakness.  In the ED CT scan revealed large subarachnoid hemorrhage likely from anterior communicating aneurysm.  Neurosurgical consulted and patient taken for coiling.  On 12/29 she had visual changes/blurry vision of the left eye and associated left-sided facial droop.  She was taken to CT no significant changes.  PCCM consulted for management assistance.  Past Medical History   Past Medical History:  Diagnosis Date  . Anxiety   . Brain tumor ([place])   . Depression   . 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  12/29 CT head  Micro Data:  SARS Coronavirus 2 negative MRSA surveillance negative    Antimicrobials:  NA    Interim history/subjective:  No events overnight.  Still has severe headaches.  Vision improved.  Objective   Blood pressure (!) 180/114, pulse (!) 50, temperature 98.8 F (37.1 C), temperature source Oral, resp. rate 18, height 5\' 3"  (1.6 m), weight 63.5 kg, SpO2 95 %.        Intake/Output Summary (Last 24 hours) at 05/09/2019 [id number] Last data filed at 05/09/2019 0700 Gross per 24 hour  Intake 4217.04 ml    Output --  Net 4217.04 ml   Filed Weights   05/06/19 0802  Weight: 63.5 kg    Examination: GEN: middle aged woman in NAD HEENT: MMM, trachea midline CV: RRR, ext warm PULM: CTAB, no accessory muscle use GI: soft, +BS EXT: No edema NEURO: Moves all 4 ext to command, no obvious deficits, detailed neuro exam per NSGY PSYCH: RASS 0 SKIN: No rashes  K/Mg low- repleted Cr good [facility] Problem list   NA  Assessment & Plan:  SAH Acom aneurysm s/p coiling 12/27 - BP goals, steroids, nimotop, AEDs, TCDs, and pressors per NSGY - Add standing APAP, clonazepam, and gabapentin, oxycodone moderate and dilaudid for severe pain; goal is to balance pain with neuro exam - DC LR, start strict I/O, keep even - DC fingersticks, all have been fine - Progressive mobility - Will follow with you  Erskine Emery MD PCCM

## 2019-05-09 NOTE — Progress Notes (Signed)
SLP Cancellation Note  Patient Details Name: Carolyn Freeman MRN: [id number] DOB: [birth year]   Cancelled treatment:       Reason Eval/Treat Not Completed: Patient at procedure or test/unavailable (having testing in room). Will f/u as able.    Osie Bond., M.A. [facility] Pager [phone] Office [phone]  05/09/2019, 10:35 AM

## 2019-05-10 ENCOUNTER — Encounter (HOSPITAL_COMMUNITY): Payer: Self-pay | Admitting: Neurosurgery

## 2019-05-10 LAB — BASIC METABOLIC PANEL
Anion gap: 11 (ref 5–15)
BUN: 7 mg/dL (ref 6–20)
CO2: 25 mmol/L (ref 22–32)
Calcium: 9.6 mg/dL (ref 8.9–10.3)
Chloride: 108 mmol/L (ref 98–111)
Creatinine, Ser: 0.81 mg/dL (ref 0.44–1.00)
GFR calc Af Amer: >60 mL/min (ref >60)
GFR calc non Af Amer: >60 mL/min (ref >60)
Glucose, Bld: 107 mg/dL — ABNORMAL HIGH (ref 70–99)
Potassium: 3.3 mmol/L — ABNORMAL LOW (ref 3.5–5.1)
Sodium: 144 mmol/L (ref 135–145)

## 2019-05-10 LAB — CBC
HCT: 41.3 % (ref 36.0–46.0)
Hemoglobin: 13.6 g/dL (ref 12.0–15.0)
MCH: 27 pg (ref 26.0–34.0)
MCHC: 32.9 g/dL (ref 30.0–36.0)
MCV: 82.1 fL (ref 80.0–100.0)
Platelets: 277 10*3/uL (ref 150–400)
RBC: 5.03 MIL/uL (ref 3.87–5.11)
RDW: 15 % (ref 11.5–15.5)
WBC: 11 10*3/uL — ABNORMAL HIGH (ref 4.0–10.5)
nRBC: 0 % (ref 0.0–0.2)

## 2019-05-10 LAB — GLUCOSE, CAPILLARY
Glucose-Capillary: 95 mg/dL (ref 70–99)
Glucose-Capillary: 113 mg/dL — ABNORMAL HIGH (ref 70–99)
Glucose-Capillary: 111 mg/dL — ABNORMAL HIGH (ref 70–99)

## 2019-05-10 LAB — PHOSPHORUS: Phosphorus: 2.6 mg/dL (ref 2.5–4.6)

## 2019-05-10 LAB — MAGNESIUM: Magnesium: 2.5 mg/dL — ABNORMAL HIGH (ref 1.7–2.4)

## 2019-05-10 MED ORDER — HYDROMORPHONE HCL 1 MG/ML IJ SOLN
1.0000 mg | Freq: Once | INTRAMUSCULAR | Status: AC
  Administered 2019-05-10: 1 mg via INTRAVENOUS
  Filled 2019-05-10: qty 1

## 2019-05-10 MED ORDER — LACTATED RINGERS IV SOLN: INTRAVENOUS | Status: DC

## 2019-05-10 MED ORDER — CLONAZEPAM 1 MG PO TABS
1.0000 mg | ORAL_TABLET | Freq: Two times a day (BID) | ORAL | Status: DC
  Administered 2019-05-10 – 2019-05-17 (×15): 1 mg via ORAL
  Filled 2019-05-10 (×15): qty 1

## 2019-05-10 MED ORDER — MAGNESIUM HYDROXIDE 400 MG/5ML PO SUSP
15.0000 mL | Freq: Every day | ORAL | Status: DC
  Administered 2019-05-10 – 2019-05-16 (×6): 15 mL via ORAL
  Filled 2019-05-10 (×7): qty 30

## 2019-05-10 MED ORDER — POTASSIUM CHLORIDE 20 MEQ/15ML (10%) PO SOLN
40.0000 meq | Freq: Two times a day (BID) | ORAL | Status: AC
  Administered 2019-05-10 (×2): 40 meq via ORAL
  Filled 2019-05-10 (×3): qty 30

## 2019-05-10 NOTE — Progress Notes (Signed)
Pt c/o constant headache/pressure, rating 10/10 even after PRN meds. No neuro changes. Called Dr. Vertell Limber to update, no new orders.

## 2019-05-10 NOTE — Progress Notes (Signed)
SLP Cancellation Note  Patient Details Name: Carolyn Freeman MRN: [id number] DOB: August 01, 1965   Cancelled treatment:       Reason Eval/Treat Not Completed: SLP screened, no needs identified, will sign off   Arlisha Patalano, Katherene Ponto 05/10/2019, 4:14 PM

## 2019-05-10 NOTE — Progress Notes (Addendum)
Subjective: Patient reports "My head hurts. It was zero until middle of the night and it came back"  Objective: Vital signs in last 24 hours: Temp:  [98.4 F (36.9 C)-99 F (37.2 C)] 99 F (37.2 C) (12/31 0400) Pulse Rate:  [40-69] 44 (12/31 0600) Resp:  [11-21] 15 (12/31 0600) BP: (106-185)/(70-110) 149/92 (12/31 0600) SpO2:  [96 %-100 %] 99 % (12/31 0600)  Intake/Output from previous day: 12/30 0701 - 12/31 0700 In: 1676.5 [I.V.:1626.4; IV Piggyback:50.1] Out: -  Intake/Output this shift: No intake/output data recorded.  Alert, reporting persistent headache. MAEW. No drift. PEARL.   Lab Results: Recent Labs    05/08/19 1711 05/10/19 0125  WBC 10.3 11.0*  HGB 12.3 13.6  HCT 36.9 41.3  PLT 212 277   BMET Recent Labs    05/08/19 1711 05/10/19 0125  NA 139 144  K 3.0* 3.3*  CL 106 108  CO2 22 25  GLUCOSE 129* 107*  BUN 7 7  CREATININE 0.57 0.81  CALCIUM 9.3 9.6    Studies/Results: CT HEAD WO CONTRAST  Result Date: 05/08/2019 CLINICAL DATA:  53 year old female with left facial droop. Recent coil embolization of ruptured ACA aneurysm. EXAM: CT HEAD WITHOUT CONTRAST TECHNIQUE: Contiguous axial images were obtained from the base of the skull through the vertex without intravenous contrast. COMPARISON:  Head CT dated 05/06/2019. FINDINGS: Brain: Bilateral subarachnoid hemorrhages mostly within the sylvian fissures and in the basilar cistern. There is extension of the blood along the anterior interhemispheric fissure. Overall similar or slightly decreased in the size of the intracranial hemorrhage since the prior CT. No intraventricular hemorrhage. No midline shift. The gray white matter discrimination is preserved. Vascular: Anterior communicating artery aneurysm coil embolization. Left ICA stent. Skull: Normal. Negative for fracture or focal lesion. Sinuses/Orbits: No acute finding. Other: None IMPRESSION: 1. Bilateral subarachnoid hemorrhages, similar or minimally  decreased in the size since the prior CT. No intraventricular hemorrhage. No midline shift. Continued close follow-up recommended. 2. Anterior communicating artery aneurysm coil embolization. Electronically Signed   By: Anner Crete M.D.   On: 05/08/2019 15:24   VAS [place] TRANSCRANIAL DOPPLER  Result Date: 05/09/2019  Transcranial Doppler Indications: Subarachnoid hemorrhage. History: History of ACA aneurysm, with history of left ICA siphon stent. Performing Technologist: Oda Cogan RDMS, RVT  Examination Guidelines: A complete evaluation includes B-mode imaging, spectral Doppler, color Doppler, and power Doppler as needed of all accessible portions of each vessel. Bilateral testing is considered an integral part of a complete examination. Limited examinations for reoccurring indications may be performed as noted.  +----------+-------------+----------+-----------+-------+ RIGHT TCD Right VM (cm)Depth (cm)PulsatilityComment +----------+-------------+----------+-----------+-------+ MCA           43.00                  1.0            +----------+-------------+----------+-----------+-------+ ACA          -41.00                  1.1            +----------+-------------+----------+-----------+-------+ Term ICA      24.00                  1.2            +----------+-------------+----------+-----------+-------+ PCA           34.00                  1.0            +----------+-------------+----------+-----------+-------+  Opthalmic     20.00                                 +----------+-------------+----------+-----------+-------+ ICA siphon    65.00                                 +----------+-------------+----------+-----------+-------+ Vertebral    -25.00                                 +----------+-------------+----------+-----------+-------+  +----------+------------+----------+-----------+-------+ LEFT TCD  Left VM (cm)Depth (cm)PulsatilityComment  +----------+------------+----------+-----------+-------+ MCA          56.00                  1.0            +----------+------------+----------+-----------+-------+ ACA          -55.00                0.80            +----------+------------+----------+-----------+-------+ Term ICA     56.00                 0.92            +----------+------------+----------+-----------+-------+ PCA          32.00                  1.0            +----------+------------+----------+-----------+-------+ Opthalmic    13.00                                 +----------+------------+----------+-----------+-------+ ICA siphon   60.00                                 +----------+------------+----------+-----------+-------+ Vertebral    -37.00                                +----------+------------+----------+-----------+-------+  +------------+-------+-------+             VM cm/sComment +------------+-------+-------+ Prox Basilar-45.00         +------------+-------+-------+ Dist Basilar-41.00         +------------+-------+-------+    Preliminary    VAS [place] TRANSCRANIAL DOPPLER  Result Date: 05/08/2019  Transcranial Doppler Indications: Subarachnoid hemorrhage. History: History of ACA aneurysm, with history of left ICA siphon stent. Performing Technologist: Oda Cogan RDMS, RVT  Examination Guidelines: A complete evaluation includes B-mode imaging, spectral Doppler, color Doppler, and power Doppler as needed of all accessible portions of each vessel. Bilateral testing is considered an integral part of a complete examination. Limited examinations for reoccurring indications may be performed as noted.  +----------+-------------+----------+-----------+-------+ RIGHT TCD Right VM (cm)Depth (cm)PulsatilityComment +----------+-------------+----------+-----------+-------+ MCA           62.00                  1.2             +----------+-------------+----------+-----------+-------+ ACA          -47.00  0.76            +----------+-------------+----------+-----------+-------+ Term ICA      50.00                  1.2            +----------+-------------+----------+-----------+-------+ PCA           44.00                  1.0            +----------+-------------+----------+-----------+-------+ Opthalmic     21.00                                 +----------+-------------+----------+-----------+-------+ ICA siphon    21.00                  1.3            +----------+-------------+----------+-----------+-------+ Vertebral    -30.00                                 +----------+-------------+----------+-----------+-------+  +----------+------------+----------+-----------+-------+ LEFT TCD  Left VM (cm)Depth (cm)PulsatilityComment +----------+------------+----------+-----------+-------+ MCA          54.00                  1.2            +----------+------------+----------+-----------+-------+ ACA          -41.00                 1.2            +----------+------------+----------+-----------+-------+ Term ICA     49.00                  1.5            +----------+------------+----------+-----------+-------+ PCA          35.00                  1.3            +----------+------------+----------+-----------+-------+ Opthalmic    17.00                  1.0            +----------+------------+----------+-----------+-------+ ICA siphon   45.00                  1.0            +----------+------------+----------+-----------+-------+ Vertebral    -34.00                                +----------+------------+----------+-----------+-------+  +------------+-------+-------+             VM cm/sComment +------------+-------+-------+ Prox Basilar-39.00         +------------+-------+-------+ Dist Basilar-62.00         +------------+-------+-------+     Preliminary     Assessment/Plan:   LOS: 4 days  Appreciate CCM's assistance.  Working to control headaches. Goal to keep BP around 180.    Carolyn Freeman 05/10/2019, 8:08 AM  Patient is stable.  Working on pain management due to headaches.  Keeping SBP around 180.

## 2019-05-10 NOTE — Progress Notes (Signed)
   NAME:  Carolyn Freeman, MRN:  [id number], DOB:  1966-03-04, LOS: 4 ADMISSION DATE:  05/06/2019, CONSULTATION DATE:  05/08/2019 REFERRING MD: Vertell Limber CHIEF COMPLAINT:  Aneurysm   Brief History   53 year old female admitted with subarachnoid hemorrhage secondary to Acom aneurysm status post coiling.  CCM consulted for management assistance.  History of present illness   53 year old female presented to the emergency department on 12/27 complaining of nausea and vomiting and severe headache that occurred upon awakening.  There was associated left-sided weakness.  In the ED CT scan revealed large subarachnoid hemorrhage likely from anterior communicating aneurysm.  Neurosurgical consulted and patient taken for coiling.  On 12/29 she had visual changes/blurry vision of the left eye and associated left-sided facial droop.  She was taken to CT no significant changes.  PCCM consulted for management assistance.  Past Medical History   Past Medical History:  Diagnosis Date  . Anxiety   . Brain tumor ([place])   . Depression   . 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  12/29 CT head  Micro Data:  SARS Coronavirus 2 negative MRSA surveillance negative    Antimicrobials:  NA    Interim history/subjective:  Headaches were better yesterday, now worse. No SOB. No chest pain.  Objective   Blood pressure (!) 149/92, pulse (!) 44, temperature 99 F (37.2 C), temperature source Oral, resp. rate 15, height 5\' 3"  (1.6 m), weight 63.5 kg, SpO2 99 %.        Intake/Output Summary (Last 24 hours) at 05/10/2019 0730 Last data filed at 05/09/2019 1800 Gross per 24 hour  Intake 1676.51 ml  Output  -  Net 1676.51 ml   Filed Weights   05/06/19 0802  Weight: 63.5 kg    Examination: GEN: middle aged woman in NAD HEENT: MMM, trachea midline CV: RRR, ext warm PULM: CTAB, no accessory muscle use GI: soft, +BS EXT: No edema NEURO: Moves all 4 ext to command, no obvious deficits, detailed neuro exam per NSGY PSYCH: RASS 0 SKIN: No rashes  K low-repleted Na/Cr a little up [facility] Problem list   NA  Assessment & Plan:  SAH Acom aneurysm s/p coiling 12/27 - BP goals, steroids, nimotop, AEDs, TCDs, and pressors per NSGY - Continue standing APAP, clonazepam, and gabapentin, oxycodone moderate and dilaudid for severe pain; goal is to balance pain with neuro exam: increase klonopin today, high level of anxiety - Start gentle LR - Progressive mobility - AC when cleared by NSGY - Will follow with you  Erskine Emery MD PCCM

## 2019-05-10 NOTE — Evaluation (Addendum)
Physical Therapy Evaluation Patient Details Name: Carolyn Freeman MRN: [id number] DOB: 19-Dec-1965 Today's Date: 05/10/2019   History of Present Illness  pt is a 53 y/o female with history of suicidal behaviors, depression, brain tumor, anxiety, admitted for [facility] due to Acom aneurysm, s/p successful coil embolization.  Clinical Impression  Pt admitted for above. At the start of the session, she stated that her head was hurting severely but was agreeable to a limited PT evaluation. She has impairments in strength, balance, and gait. She is independent with bed mobility, min guard for transfers for safety, and min assist for gait due to unsteadiness on feet. During gait, there were several times when she staggered left or right and needed assistance to right herself with SPT use of gait belt. Lethargy and pain were most likely the most limiting factors today, but will continue to assess balance deficits in future sessions. VSS throughout. She denies blurred vision today and states that it is much better than yesterday. Recommend HH PT to help address deficits following discharge. She would benefit from continued skilled acute PT intervention in order to address deficits.     Follow Up Recommendations Home health PT    Equipment Recommendations  Other (comment)(TBD)    Recommendations for Other Services       Precautions / Restrictions Precautions Precautions: None Restrictions Weight Bearing Restrictions: No      Mobility  Bed Mobility Overal bed mobility: Independent                Transfers Overall transfer level: Needs assistance Equipment used: None Transfers: Sit to/from Stand Sit to Stand: Min guard         General transfer comment: min guard for safety; pt a little unsteady upon standing but did not require physical assistance to stand; did plop back down on mattress suddenly but stood up again without difficulty  Ambulation/Gait Ambulation/Gait assistance: Min  assist Gait Distance (Feet): 20 Feet Assistive device: None Gait Pattern/deviations: Step-through pattern;Drifts right/left Gait velocity: decreased   General Gait Details: no AD used but pt had multiple bouts of unsteadiness where SPT had to help right her with gait belt; no full LOB however  Stairs            Wheelchair Mobility    Modified Rankin (Stroke Patients Only)       Balance Overall balance assessment: Needs assistance Sitting-balance support: No upper extremity supported Sitting balance-Leahy Scale: Normal     Standing balance support: No upper extremity supported Standing balance-Leahy Scale: Fair Standing balance comment: able to stand without AD, but did quickly plop onto mattress once; unsteadiness during gait                             Pertinent Vitals/Pain Pain Assessment: Faces Faces Pain Scale: Hurts even more Pain Location: head Pain Descriptors / Indicators: Aching Pain Intervention(s): Limited activity within patient's tolerance;Monitored during session    Home Living Family/patient expects to be discharged to:: Private residence Living Arrangements: Alone Available Help at Discharge: Family;Available 24 hours/day Type of Home: Apartment Home Access: Level entry     Home Layout: One level Home Equipment: None      Prior Function Level of Independence: Independent               Hand Dominance   Dominant Hand: Right    Extremity/Trunk Assessment   Upper Extremity Assessment Upper Extremity Assessment: Overall WFL for tasks assessed  Lower Extremity Assessment Lower Extremity Assessment: Overall WFL for tasks assessed    Cervical / Trunk Assessment Cervical / Trunk Assessment: Normal  Communication   Communication: No difficulties  Cognition Arousal/Alertness: Lethargic Behavior During Therapy: WFL for tasks assessed/performed Overall Cognitive Status: Within Functional Limits for tasks assessed                                         General Comments      Exercises     Assessment/Plan    PT Assessment Patient needs continued PT services  PT Problem List Decreased strength;Decreased activity tolerance;Decreased balance;Decreased safety awareness;Pain;Decreased mobility;Decreased coordination       PT Treatment Interventions DME instruction;Gait training;Stair training;Functional mobility training;Therapeutic activities;Therapeutic exercise;Balance training;Neuromuscular re-education;Patient/family education;Modalities    PT Goals (Current goals can be found in the Care Plan section)  Acute Rehab PT Goals Patient Stated Goal: get my head to stop hurting PT Goal Formulation: With patient Time For Goal Achievement: 05/24/19 Potential to Achieve Goals: Good    Frequency Min 4X/week   Barriers to discharge        Co-evaluation               AM-PAC PT "6 Clicks" Mobility  Outcome Measure Help needed turning from your back to your side while in a flat bed without using bedrails?: None Help needed moving from lying on your back to sitting on the side of a flat bed without using bedrails?: None Help needed moving to and from a bed to a chair (including a wheelchair)?: A Little Help needed standing up from a chair using your arms (e.g., wheelchair or bedside chair)?: A Little Help needed to walk in hospital room?: A Little Help needed climbing 3-5 steps with a railing? : A Little 6 Click Score: 20    End of Session Equipment Utilized During Treatment: Gait belt Activity Tolerance: Patient limited by lethargy;Patient limited by pain Patient left: in bed;with call bell/phone within reach Nurse Communication: Mobility status PT Visit Diagnosis: Other abnormalities of gait and mobility (R26.89);Pain;Unsteadiness on feet (R26.81) Pain - part of body: (head)    Time: [id number] PT Time Calculation (min) (ACUTE ONLY): 17 min   Charges:   PT  Evaluation $PT Eval Moderate Complexity: 1 Mod          Christel Mormon, SPT  Carolyn Freeman 05/10/2019, 1:08 PM

## 2019-05-11 ENCOUNTER — Inpatient Hospital Stay (HOSPITAL_COMMUNITY): Payer: BC Managed Care – PPO

## 2019-05-11 DIAGNOSIS — I609 Nontraumatic subarachnoid hemorrhage, unspecified: Secondary | ICD-10-CM

## 2019-05-11 DIAGNOSIS — N179 Acute kidney failure, unspecified: Secondary | ICD-10-CM

## 2019-05-11 DIAGNOSIS — R52 Pain, unspecified: Secondary | ICD-10-CM

## 2019-05-11 DIAGNOSIS — I10 Essential (primary) hypertension: Secondary | ICD-10-CM

## 2019-05-11 LAB — CBC WITH DIFFERENTIAL/PLATELET
Abs Immature Granulocytes: 0.02 10*3/uL (ref 0.00–0.07)
Basophils Absolute: 0 10*3/uL (ref 0.0–0.1)
Basophils Relative: 0 %
Eosinophils Absolute: 0 10*3/uL (ref 0.0–0.5)
Eosinophils Relative: 0 %
HCT: 39.3 % (ref 36.0–46.0)
Hemoglobin: 13.2 g/dL (ref 12.0–15.0)
Immature Granulocytes: 0 %
Lymphocytes Relative: 16 %
Lymphs Abs: 1.4 10*3/uL (ref 0.7–4.0)
MCH: 27.1 pg (ref 26.0–34.0)
MCHC: 33.6 g/dL (ref 30.0–36.0)
MCV: 80.7 fL (ref 80.0–100.0)
Monocytes Absolute: 0.9 10*3/uL (ref 0.1–1.0)
Monocytes Relative: 10 %
Neutro Abs: 6.6 10*3/uL (ref 1.7–7.7)
Neutrophils Relative %: 74 %
Platelets: 217 10*3/uL (ref 150–400)
RBC: 4.87 MIL/uL (ref 3.87–5.11)
RDW: 14.5 % (ref 11.5–15.5)
WBC: 8.9 10*3/uL (ref 4.0–10.5)
nRBC: 0 % (ref 0.0–0.2)

## 2019-05-11 LAB — CBC
HCT: 39.3 % (ref 36.0–46.0)
Hemoglobin: 12.9 g/dL (ref 12.0–15.0)
MCH: 26.8 pg (ref 26.0–34.0)
MCHC: 32.8 g/dL (ref 30.0–36.0)
MCV: 81.7 fL (ref 80.0–100.0)
Platelets: 218 10*3/uL (ref 150–400)
RBC: 4.81 MIL/uL (ref 3.87–5.11)
RDW: 14.6 % (ref 11.5–15.5)
WBC: 8.1 10*3/uL (ref 4.0–10.5)
nRBC: 0 % (ref 0.0–0.2)

## 2019-05-11 LAB — BASIC METABOLIC PANEL
Anion gap: 10 (ref 5–15)
BUN: 16 mg/dL (ref 6–20)
CO2: 22 mmol/L (ref 22–32)
Calcium: 9.4 mg/dL (ref 8.9–10.3)
Chloride: 107 mmol/L (ref 98–111)
Creatinine, Ser: 1.07 mg/dL — ABNORMAL HIGH (ref 0.44–1.00)
GFR calc Af Amer: >60 mL/min (ref >60)
GFR calc non Af Amer: 60 mL/min — ABNORMAL LOW (ref >60)
Glucose, Bld: 99 mg/dL (ref 70–99)
Potassium: 3.7 mmol/L (ref 3.5–5.1)
Sodium: 139 mmol/L (ref 135–145)

## 2019-05-11 LAB — MAGNESIUM: Magnesium: 2.2 mg/dL (ref 1.7–2.4)

## 2019-05-11 LAB — PHOSPHORUS: Phosphorus: 3.4 mg/dL (ref 2.5–4.6)

## 2019-05-11 NOTE — Progress Notes (Addendum)
Transcranial Doppler  Date POD PCO2 HCT BP  MCA ACA PCA OPHT SIPH VERT Basilar  12/29,rs     Right  Left   62  54   -47  -41   44  35   21  17   21   45   -30  -34   -62      12/30,rs     Right  Left   43  56   -41  -55   34  32   20  13   65  60   -25  -37     -45    1/1 MR     Right  Left   65  48   -60  -24   23  28   15  20    50  37   38       -28          Right  Left                                             Right  Left                                            Right  Left                                            Right  Left                                        MCA = Middle Cerebral Artery      OPHT = Opthalmic Artery     BASILAR = Basilar Artery   ACA = Anterior Cerebral Artery     SIPH = Carotid Siphon PCA = Posterior Cerebral Artery   VERT = Verterbral Artery                   Normal MCA = 62+\-12 ACA = 50+\-12 PCA = 42+\-23     Abram Sander 05/11/2019 11:26 AM

## 2019-05-11 NOTE — Progress Notes (Signed)
   NAME:  Carolyn Freeman, MRN:  [id number], DOB:  09-04-1965, LOS: 5 ADMISSION DATE:  05/06/2019, CONSULTATION DATE:  05/08/2019 REFERRING MD: Vertell Limber CHIEF COMPLAINT:  Aneurysm   Brief History   54 year old female admitted with subarachnoid hemorrhage secondary to Acom aneurysm status post coiling.  CCM consulted for management assistance.  History of present illness   54 year old female presented to the emergency department on 12/27 complaining of nausea and vomiting and severe headache that occurred upon awakening.  There was associated left-sided weakness.  In the ED CT scan revealed large subarachnoid hemorrhage likely from anterior communicating aneurysm.  Neurosurgical consulted and patient taken for coiling.  On 12/29 she had visual changes/blurry vision of the left eye and associated left-sided facial droop.  She was taken to CT no significant changes.  PCCM consulted for management assistance.  Past Medical History   Past Medical History:  Diagnosis Date  . Anxiety   . Brain tumor ([place])   . Depression   . Suicidal behavior      [place] [facility] Events   12/27 Admitted, coiling of aneurysm   Consults:  Neuro Neurosurgery PCCM   Procedures:  12/27 Coiling of Acom aneurysm  Significant Diagnostic Tests:  12/27 CTA Head 5 mm saccular Aneurysm of the Anterior Communicating Artery, which is favored to be the source of acute hemorrhage.  12/29 CT head  Micro Data:  SARS Coronavirus 2 negative MRSA surveillance negative    Antimicrobials:  NA    Interim history/subjective:  Awake alert sitting up eating.  Complains of headache is neurologically intact  Objective   Blood pressure (!) 131/100, pulse 93, temperature 99 F (37.2 C), temperature source Oral, resp. rate (!) 21, height 5\' 3"  (1.6 m), weight 63.5 kg, SpO2 100 %.        Intake/Output Summary (Last 24 hours) at 05/11/2019 0805 Last data filed at 05/10/2019 1700 Gross per 24 hour  Intake 794.87 ml   Output -  Net 794.87 ml   Filed Weights   05/06/19 0802  Weight: 63.5 kg    Examination: General: Well-nourished well-developed female sitting up eating complaining of headache HEENT: No JVD or lymphadenopathy is appreciated Neuro: Grossly intact no focal defects noted CV: Heart sounds regular regular rate rhythm PULM: Diminished in bases otherwise clear GI: soft, bsx4 active  GU: Voids Extremities: warm/dry,  edema  Skin: no rashes or lesions No chest x-ray to review Labs reviewed 05/11/2019   Resolved Hospital Problem list   NA  Assessment & Plan:  [facility] Acom aneurysm s/p coiling 12/27 Blood pressure goals along with steroids Nimotop and pain medications per neurosurgery Currently off vasopressors Klonopin Neurontin oxycodone and Dilaudid as needed for pain Gentle hydration Increase mobility per neurosurgery Pulmonary critical care available as needed   App cct 30 min  Richardson Landry Minor ACNP Acute Care Nurse Practitioner [place] Please consult [place] 05/11/2019, 8:08 AM

## 2019-05-11 NOTE — Progress Notes (Signed)
Lethargic, difficulty staying awake during the exam Moves all extremities Pupils are equal, round and reactive.  Will monitor closely today

## 2019-05-12 ENCOUNTER — Inpatient Hospital Stay (HOSPITAL_COMMUNITY): Payer: BC Managed Care – PPO

## 2019-05-12 LAB — CBC
HCT: 39 % (ref 36.0–46.0)
Hemoglobin: 13.1 g/dL (ref 12.0–15.0)
MCH: 27 pg (ref 26.0–34.0)
MCHC: 33.6 g/dL (ref 30.0–36.0)
MCV: 80.4 fL (ref 80.0–100.0)
Platelets: 221 10*3/uL (ref 150–400)
RBC: 4.85 MIL/uL (ref 3.87–5.11)
RDW: 14.4 % (ref 11.5–15.5)
WBC: 9.1 10*3/uL (ref 4.0–10.5)
nRBC: 0 % (ref 0.0–0.2)

## 2019-05-12 LAB — BASIC METABOLIC PANEL
Anion gap: 14 (ref 5–15)
BUN: 12 mg/dL (ref 6–20)
CO2: 20 mmol/L — ABNORMAL LOW (ref 22–32)
Calcium: 9.1 mg/dL (ref 8.9–10.3)
Chloride: 103 mmol/L (ref 98–111)
Creatinine, Ser: 0.81 mg/dL (ref 0.44–1.00)
GFR calc Af Amer: >60 mL/min (ref >60)
GFR calc non Af Amer: >60 mL/min (ref >60)
Glucose, Bld: 112 mg/dL — ABNORMAL HIGH (ref 70–99)
Potassium: 3.6 mmol/L (ref 3.5–5.1)
Sodium: 137 mmol/L (ref 135–145)

## 2019-05-12 LAB — URINE CULTURE

## 2019-05-12 LAB — PHOSPHORUS: Phosphorus: 3.7 mg/dL (ref 2.5–4.6)

## 2019-05-12 LAB — MAGNESIUM: Magnesium: 1.8 mg/dL (ref 1.7–2.4)

## 2019-05-12 MED ORDER — SODIUM CHLORIDE 0.9 % IV SOLN
250.0000 mL | INTRAVENOUS | Status: DC
  Administered 2019-05-12: 22:00:00 250 mL via INTRAVENOUS
  Administered 2019-05-12 – 2019-05-14 (×3): 1000 mL via INTRAVENOUS
  Administered 2019-05-14 – 2019-05-15 (×2): 250 mL via INTRAVENOUS

## 2019-05-12 NOTE — Progress Notes (Signed)
Pt having persistent fevers of 102+ most of the day.  Current temp 102.7   Margo Aye, NP notified.  Pt has scheduled tylenol.  CXR ordered.  Will continue to monitor.

## 2019-05-12 NOTE — Progress Notes (Signed)
Subjective: Patient reports significant headache today.  No visual changes or numbness or tingling or weakness or nausea and vomiting  Objective: Vital signs in last 24 hours: Temp:  [99.1 F (37.3 C)-102.5 F (39.2 C)] 99.6 F (37.6 C) (01/02 0400) Pulse Rate:  [44-61] 51 (01/02 0800) Resp:  [12-22] 17 (01/02 0800) BP: (126-170)/(82-100) 150/88 (01/02 0800) SpO2:  [99 %-100 %] 99 % (01/02 0800)  Intake/Output from previous day: 01/01 0701 - 01/02 0700 In: 2336.5 [P.O.:60; I.V.:2276.5] Out: -  Intake/Output this shift: Total I/O In: 75.1 [I.V.:75.1] Out: -   She is easily arousable and is appropriate in conversation and moves all extremities.  Lab Results: Lab Results  Component Value Date   WBC 9.1 05/12/2019   HGB 13.1 05/12/2019   HCT 39.0 05/12/2019   MCV 80.4 05/12/2019   PLT 221 05/12/2019   Lab Results  Component Value Date   INR 0.9 05/06/2019   BMET Lab Results  Component Value Date   NA 137 05/12/2019   K 3.6 05/12/2019   CL 103 05/12/2019   CO2 20 (L) 05/12/2019   GLUCOSE 112 (H) 05/12/2019   BUN 12 05/12/2019   CREATININE 0.81 05/12/2019   CALCIUM 9.1 05/12/2019    Studies/Results: VAS [place] TRANSCRANIAL DOPPLER  Result Date: 05/11/2019  Transcranial Doppler Indications: Subarachnoid hemorrhage. Comparison Study: previous study 05/09/19 Performing Technologist: Abram Sander RVS  Examination Guidelines: A complete evaluation includes B-mode imaging, spectral Doppler, color Doppler, and power Doppler as needed of all accessible portions of each vessel. Bilateral testing is considered an integral part of a complete examination. Limited examinations for reoccurring indications may be performed as noted.  +----------+-------------+----------+-----------+-------+ RIGHT TCD Right VM (cm)Depth (cm)PulsatilityComment +----------+-------------+----------+-----------+-------+ MCA           65.00                 0.76             +----------+-------------+----------+-----------+-------+ ACA          -60.00                 0.54            +----------+-------------+----------+-----------+-------+ Term ICA      30.00                 0.76            +----------+-------------+----------+-----------+-------+ PCA           23.00                 0.95            +----------+-------------+----------+-----------+-------+ Opthalmic     15.00                 1.17            +----------+-------------+----------+-----------+-------+ ICA siphon    50.00                 1.11            +----------+-------------+----------+-----------+-------+ Vertebral     38.00                 0.99            +----------+-------------+----------+-----------+-------+  +----------+------------+----------+-----------+-------+ LEFT TCD  Left VM (cm)Depth (cm)PulsatilityComment +----------+------------+----------+-----------+-------+ MCA          48.00                 1.05            +----------+------------+----------+-----------+-------+  ACA          -24.00                1.07            +----------+------------+----------+-----------+-------+ Term ICA     39.00                 1.21            +----------+------------+----------+-----------+-------+ PCA          28.00                 0.69            +----------+------------+----------+-----------+-------+ Opthalmic    20.00                 0.63            +----------+------------+----------+-----------+-------+ ICA siphon   37.00                 0.91            +----------+------------+----------+-----------+-------+  +------------+-------+-------+             VM cm/sComment +------------+-------+-------+ Dist Basilar-28.00         +------------+-------+-------+    Preliminary     Assessment/Plan: TCD showed no evidence of vasospasm.  Sodium okay.  Increase IV fluids 200 cc an hour.  Repeat CT scan today.  Continue ICU  care.  Estimated body mass index is 24.8 kg/m as calculated from the following:   Height as of this encounter: 5\' 3"  (1.6 m).   Weight as of this encounter: 63.5 kg.    LOS: 6 days    Carolyn Freeman 05/12/2019, 9:55 AM

## 2019-05-13 LAB — CBC
HCT: 40.5 % (ref 36.0–46.0)
Hemoglobin: 13.6 g/dL (ref 12.0–15.0)
MCH: 26.9 pg (ref 26.0–34.0)
MCHC: 33.6 g/dL (ref 30.0–36.0)
MCV: 80 fL (ref 80.0–100.0)
Platelets: 214 10*3/uL (ref 150–400)
RBC: 5.06 MIL/uL (ref 3.87–5.11)
RDW: 14.2 % (ref 11.5–15.5)
WBC: 9.5 10*3/uL (ref 4.0–10.5)
nRBC: 0 % (ref 0.0–0.2)

## 2019-05-13 LAB — BASIC METABOLIC PANEL
Anion gap: 11 (ref 5–15)
BUN: 8 mg/dL (ref 6–20)
CO2: 21 mmol/L — ABNORMAL LOW (ref 22–32)
Calcium: 9 mg/dL (ref 8.9–10.3)
Chloride: 104 mmol/L (ref 98–111)
Creatinine, Ser: 0.72 mg/dL (ref 0.44–1.00)
GFR calc Af Amer: >60 mL/min (ref >60)
GFR calc non Af Amer: >60 mL/min (ref >60)
Glucose, Bld: 135 mg/dL — ABNORMAL HIGH (ref 70–99)
Potassium: 3 mmol/L — ABNORMAL LOW (ref 3.5–5.1)
Sodium: 136 mmol/L (ref 135–145)

## 2019-05-13 LAB — MAGNESIUM: Magnesium: 1.7 mg/dL (ref 1.7–2.4)

## 2019-05-13 LAB — PHOSPHORUS: Phosphorus: 2.2 mg/dL — ABNORMAL LOW (ref 2.5–4.6)

## 2019-05-13 NOTE — Progress Notes (Signed)
Subjective: Patient reports Patient is awake and alert strength 5 out of 5 no pronator drift no significant change still with persistent headache  Objective: Vital signs in last 24 hours: Temp:  [101.9 F (38.8 C)-103.3 F (39.6 C)] 103.3 F (39.6 C) (01/03 0800) Pulse Rate:  [47-111] 59 (01/03 0800) Resp:  [12-36] 15 (01/03 0800) BP: (132-176)/(83-107) 146/84 (01/03 0800) SpO2:  [95 %-100 %] 100 % (01/03 0800)  Intake/Output from previous day: 01/02 0701 - 01/03 0700 In: 2411 [P.O.:240; I.V.:2171] Out: -  Intake/Output this shift: Total I/O In: 100 [I.V.:100] Out: -   Awake and alert strength 5-5 no pronator drift  Lab Results: Recent Labs    05/12/19 0531 05/13/19 0530  WBC 9.1 9.5  HGB 13.1 13.6  HCT 39.0 40.5  PLT 221 214   BMET Recent Labs    05/12/19 0531 05/13/19 0530  NA 137 136  K 3.6 3.0*  CL 103 104  CO2 20* 21*  GLUCOSE 112* 135*  BUN 12 8  CREATININE 0.81 0.72  CALCIUM 9.1 9.0    Studies/Results: CT HEAD WO CONTRAST  Result Date: 05/12/2019 CLINICAL DATA:  F/U ICH EXAM: CT HEAD WITHOUT CONTRAST TECHNIQUE: Contiguous axial images were obtained from the base of the skull through the vertex without intravenous contrast. COMPARISON:  Head CT 05/08/2019 FINDINGS: Brain: Overall decrease in bilateral subarachnoid hemorrhage with some residual blood products in the bilateral sylvian fissures. No new hemorrhage identified. No evidence of acute infarction, hydrocephalus, extra-axial collection or mass lesion/mass effect. Vascular: ACA aneurysm coil embolization. Left ICA stent. Skull: Normal. Negative for fracture or focal lesion. Sinuses/Orbits: No acute finding. Other: None. IMPRESSION: Overall decrease in bilateral subarachnoid hemorrhage with some residual blood products in the bilateral Sylvian fissures. Electronically Signed   By: Audie Pinto M.D.   On: 05/12/2019 14:57   DG CHEST PORT 1 VIEW  Result Date: 05/13/2019 CLINICAL DATA:  Fever. EXAM:  PORTABLE CHEST 1 VIEW COMPARISON:  None. FINDINGS: Low lung volumes. Mild cardiomegaly with tortuous thoracic aorta. Minimal hazy opacity at the left lung base favoring atelectasis. No evidence of pleural effusion or pneumothorax. No pulmonary edema. IMPRESSION: 1. Low lung volumes with mild hazy opacity at the left lung base, favoring atelectasis. 2. Mild cardiomegaly with tortuous thoracic aorta. Electronically Signed   By: Keith Rake M.D.   On: 05/13/2019 01:36   VAS [place] TRANSCRANIAL DOPPLER  Result Date: 05/11/2019  Transcranial Doppler Indications: Subarachnoid hemorrhage. Comparison Study: previous study 05/09/19 Performing Technologist: Abram Sander RVS  Examination Guidelines: A complete evaluation includes B-mode imaging, spectral Doppler, color Doppler, and power Doppler as needed of all accessible portions of each vessel. Bilateral testing is considered an integral part of a complete examination. Limited examinations for reoccurring indications may be performed as noted.  +----------+-------------+----------+-----------+-------+ RIGHT TCD Right VM (cm)Depth (cm)PulsatilityComment +----------+-------------+----------+-----------+-------+ MCA           65.00                 0.76            +----------+-------------+----------+-----------+-------+ ACA          -60.00                 0.54            +----------+-------------+----------+-----------+-------+ Term ICA      30.00                 0.76            +----------+-------------+----------+-----------+-------+  PCA           23.00                 0.95            +----------+-------------+----------+-----------+-------+ Opthalmic     15.00                 1.17            +----------+-------------+----------+-----------+-------+ ICA siphon    50.00                 1.11            +----------+-------------+----------+-----------+-------+ Vertebral     38.00                 0.99             +----------+-------------+----------+-----------+-------+  +----------+------------+----------+-----------+-------+ LEFT TCD  Left VM (cm)Depth (cm)PulsatilityComment +----------+------------+----------+-----------+-------+ MCA          48.00                 1.05            +----------+------------+----------+-----------+-------+ ACA          -24.00                1.07            +----------+------------+----------+-----------+-------+ Term ICA     39.00                 1.21            +----------+------------+----------+-----------+-------+ PCA          28.00                 0.69            +----------+------------+----------+-----------+-------+ Opthalmic    20.00                 0.63            +----------+------------+----------+-----------+-------+ ICA siphon   37.00                 0.91            +----------+------------+----------+-----------+-------+  +------------+-------+-------+             VM cm/sComment +------------+-------+-------+ Dist Basilar-28.00         +------------+-------+-------+    Preliminary     Assessment/Plan: Hospital day 8 post bleed day 8 subarachnoid hemorrhage with some clinical vasospasm without CT radiographic change.  Patient mains neurologically intact continue aggressive support.  LOS: 7 days     Vraj Denardo P 05/13/2019, 8:38 AM

## 2019-05-13 NOTE — Evaluation (Signed)
Occupational Therapy Evaluation Patient Details Name: Carolyn Freeman MRN: [id number] DOB: [birth year] Today's Date: 05/13/2019    History of Present Illness pt is a 54 y/o female with history of suicidal behaviors, depression, brain tumor, anxiety, admitted for [facility] due to Acom aneurysm, s/p successful coil embolization.   Clinical Impression   PT reevaluated for OT s/p fall on bedside commode this morning. Pt currently with 10/10 c/o of HA but agreeable to bed level movement. Pt able to hold warm wash cloth to face and reports the heat over eyes helps give some relief. Pt currently with functional limitiations due to the deficits listed below (see OT problem list). Pt independent at bed level but will further assess OOB when patient agreeable.  Pt will benefit from skilled OT to increase their independence and safety with adls and balance to allow discharge [place].     Follow Up Recommendations  Home health OT    Equipment Recommendations  3 in 1 bedside commode    Recommendations for Other Services       Precautions / Restrictions Precautions Precautions: None Restrictions Weight Bearing Restrictions: No      Mobility Bed Mobility Overal bed mobility: Independent                Transfers Overall transfer level: Modified independent                    Balance     Sitting balance-Leahy Scale: Normal                                     ADL either performed or assessed with clinical judgement   ADL Overall ADL's : Needs assistance/impaired Eating/Feeding: Independent   Grooming: Independent   Upper Body Bathing: Independent   Lower Body Bathing: Supervison/ safety   Upper Body Dressing : Independent   Lower Body Dressing: Supervision/safety                 General ADL Comments: bed level only evaluation due to HA and light sensitive at this time. pt able to reach feed to demonstrate bed level dressing. pt with fall this  morning with staff so will need to continue to follow for basic transfer needs. pt able to long sit in the bed independent but quickly returning to supine due to increased HA. BP noted to be 152/102     Vision Baseline Vision/History: Wears glasses Wears Glasses: Reading only Patient Visual Report: No change from baseline Vision Assessment?: No apparent visual deficits     Perception     Praxis      Pertinent Vitals/Pain Pain Assessment: Faces Faces Pain Scale: Hurts worst Pain Location: head Pain Descriptors / Indicators: Aching Pain Intervention(s): Repositioned;Monitored during session     Hand Dominance Right   Extremity/Trunk Assessment Upper Extremity Assessment Upper Extremity Assessment: Overall WFL for tasks assessed   Lower Extremity Assessment Lower Extremity Assessment: Overall WFL for tasks assessed   Cervical / Trunk Assessment Cervical / Trunk Assessment: Normal   Communication Communication Communication: No difficulties   Cognition Arousal/Alertness: Awake/alert Behavior During Therapy: WFL for tasks assessed/performed Overall Cognitive Status: Impaired/Different from baseline                                 General Comments: pt currently with posey belt due to fall this Am  with RN staff to bedside in chart. Pt impulsive   General Comments  increased BP with long sitting in bed    Exercises     Shoulder Instructions      Home Living Family/patient expects to be discharged to:: Private residence Living Arrangements: Alone Available Help at Discharge: Family;Available 24 hours/day Type of Home: Apartment Home Access: Level entry     Home Layout: One level     Bathroom Shower/Tub: Teacher, early years/pre: Standard Bathroom Accessibility: Yes   Home Equipment: None          Prior Functioning/Environment Level of Independence: Independent                 OT Problem List: Decreased activity  tolerance;Impaired balance (sitting and/or standing);Decreased cognition;Decreased safety awareness;Pain      OT Treatment/Interventions: Self-care/ADL training;Therapeutic exercise;Energy conservation;DME and/or AE instruction;Manual therapy;Therapeutic activities;Cognitive remediation/compensation;Patient/family education;Balance training    OT Goals(Current goals can be found in the care plan section) Acute Rehab OT Goals Patient Stated Goal: get my head to stop hurting OT Goal Formulation: With patient Time For Goal Achievement: 05/27/19 Potential to Achieve Goals: Good  OT Frequency: Min 2X/week   Barriers to D/C: Decreased caregiver support          Co-evaluation              AM-PAC OT "6 Clicks" Daily Activity     Outcome Measure Help from another person eating meals?: None Help from another person taking care of personal grooming?: None Help from another person toileting, which includes using toliet, bedpan, or urinal?: None Help from another person bathing (including washing, rinsing, drying)?: None Help from another person to put on and taking off regular upper body clothing?: None Help from another person to put on and taking off regular lower body clothing?: None 6 Click Score: 24   End of Session Nurse Communication: Mobility status;Precautions  Activity Tolerance: Patient limited by pain Patient left: in bed;with call bell/phone within reach;with bed alarm set;with restraints reapplied  OT Visit Diagnosis: Unsteadiness on feet (R26.81);Pain Pain - part of body: (headache)                Time: [id number] OT Time Calculation (min): 17 min Charges:  OT General Charges $OT Visit: 1 Visit OT Evaluation $OT Eval Moderate Complexity: 1 Mod   Brynn, OTR/L  Acute Rehabilitation Services Pager: [phone] Office: [phone] .   Jeri Modena 05/13/2019, 2:04 PM

## 2019-05-13 NOTE — Progress Notes (Addendum)
Approximately around 0100 Pt ambulated without assistance to the bedside commode.  The patient lost balance on BSC and fell onto floor, possibly slipping on floor because it was wet with urine.  The only injury noted was a red mark on patients back.  Neuro exam still consistent with previous neuro exams.  Vital signs stable. Patient cleaned up and returned safely to bed.  Patient educated on calling and call bell use.  Bed alarm on, bed in lowest position.  Floor mats placed.            Margo Aye, NP , on call notified, updated on pts condition.  No new orders.  Will monitor closely.

## 2019-05-14 ENCOUNTER — Inpatient Hospital Stay (HOSPITAL_COMMUNITY): Payer: BC Managed Care – PPO

## 2019-05-14 DIAGNOSIS — I609 Nontraumatic subarachnoid hemorrhage, unspecified: Secondary | ICD-10-CM

## 2019-05-14 LAB — BASIC METABOLIC PANEL
Anion gap: 9 (ref 5–15)
BUN: 7 mg/dL (ref 6–20)
CO2: 23 mmol/L (ref 22–32)
Calcium: 8.9 mg/dL (ref 8.9–10.3)
Chloride: 105 mmol/L (ref 98–111)
Creatinine, Ser: 0.82 mg/dL (ref 0.44–1.00)
GFR calc Af Amer: >60 mL/min (ref >60)
GFR calc non Af Amer: >60 mL/min (ref >60)
Glucose, Bld: 103 mg/dL — ABNORMAL HIGH (ref 70–99)
Potassium: 3.2 mmol/L — ABNORMAL LOW (ref 3.5–5.1)
Sodium: 137 mmol/L (ref 135–145)

## 2019-05-14 LAB — CBC
HCT: 35.7 % — ABNORMAL LOW (ref 36.0–46.0)
Hemoglobin: 11.9 g/dL — ABNORMAL LOW (ref 12.0–15.0)
MCH: 27.2 pg (ref 26.0–34.0)
MCHC: 33.3 g/dL (ref 30.0–36.0)
MCV: 81.5 fL (ref 80.0–100.0)
Platelets: 189 10*3/uL (ref 150–400)
RBC: 4.38 MIL/uL (ref 3.87–5.11)
RDW: 14.4 % (ref 11.5–15.5)
WBC: 8.5 10*3/uL (ref 4.0–10.5)
nRBC: 0 % (ref 0.0–0.2)

## 2019-05-14 LAB — PHOSPHORUS: Phosphorus: 3.6 mg/dL (ref 2.5–4.6)

## 2019-05-14 LAB — MAGNESIUM: Magnesium: 1.9 mg/dL (ref 1.7–2.4)

## 2019-05-14 MED ORDER — POTASSIUM CHLORIDE 20 MEQ PO PACK
20.0000 meq | PACK | Freq: Two times a day (BID) | ORAL | Status: AC
  Administered 2019-05-14 – 2019-05-15 (×4): 20 meq via ORAL
  Filled 2019-05-14 (×4): qty 1

## 2019-05-14 NOTE — Progress Notes (Signed)
Physical Therapy Treatment Patient Details Name: Carolyn Freeman MRN: [id number] DOB: [birth year] Today's Date: 05/14/2019    History of Present Illness pt is a 54 y/o female with history of suicidal behaviors, depression, brain tumor, anxiety, admitted for [facility] due to Acom aneurysm, s/p successful coil embolization.    PT Comments    Per chart pt fell yesterday going to bathroom. Pt with noted R knee buckling today and noted weakness in MMT compared to L LE. Pt requiring minA and use of IV pole for safe ambulation this date. Pt unsafe to return home alone and will need 24/7 assist. Pt is at an increased fall risk. Acute PT to cont to follow to progress independence and address weakness and balance impairments. This does seem to be a functional regression as pt was reported to be near baseline last PT note. Acute PT to cont to follow.    Follow Up Recommendations  Home health PT;Supervision/Assistance - 24 hour     Equipment Recommendations  Cane    Recommendations for Other Services       Precautions / Restrictions Precautions Precautions: Fall Restrictions Weight Bearing Restrictions: No    Mobility  Bed Mobility Overal bed mobility: Modified Independent             General bed mobility comments: HOB elevated, transferred self to EOB  Transfers Overall transfer level: Needs assistance Equipment used: None Transfers: Sit to/from Stand Sit to Stand: Min guard;Min assist         General transfer comment: min guard initially but then with R knee buckling requiring minA to prevent fall upon standing, pt reaching for foot of bed to hold onto  Ambulation/Gait Ambulation/Gait assistance: Min assist Gait Distance (Feet): 150 Feet Assistive device: None Gait Pattern/deviations: Step-through pattern;Drifts right/left Gait velocity: dec Gait velocity interpretation: <1.31 ft/sec, indicative of household ambulator General Gait Details: pt initially with no AD however  reaching for railings or counters to hold onto. Pt used IV pole as a cane. Pt with 3 episodes of R Knee buckling requiring minA to prevent fall. pt alsow ith 2 episodes of crossover gait when trying to look behind her or turning her head   Stairs             Wheelchair Mobility    Modified Rankin (Stroke Patients Only) Modified Rankin (Stroke Patients Only) Pre-Morbid Rankin Score: No symptoms Modified Rankin: Moderate disability     Balance Overall balance assessment: Needs assistance Sitting-balance support: No upper extremity supported Sitting balance-Leahy Scale: Normal Sitting balance - Comments: pt able to don socks sitting EOB   Standing balance support: No upper extremity supported Standing balance-Leahy Scale: Poor Standing balance comment: pt with R knee instability requiring IV pole or physical assist to steady                            Cognition Arousal/Alertness: Awake/alert Behavior During Therapy: WFL for tasks assessed/performed Overall Cognitive Status: Impaired/Different from baseline Area of Impairment: Problem solving                             Problem Solving: Slow processing General Comments: pt reports "I really fell 3 times yesterday but I just told them one" pt noted have delayed response time and increased lethargy today compared to initial evaluation      Exercises      General Comments General comments (skin integrity, edema,  etc.): VSS      Pertinent Vitals/Pain Pain Assessment: 0-10 Pain Score: 6  Pain Location: head Pain Descriptors / Indicators: Headache Pain Intervention(s): Monitored during session    Home Living                      Prior Function            PT Goals (current goals can now be found in the care plan section) Progress towards PT goals: Not progressing toward goals - comment(regressing functionally)    Frequency    Min 4X/week      PT Plan Current plan remains  appropriate    Co-evaluation              AM-PAC PT "6 Clicks" Mobility   Outcome Measure  Help needed turning from your back to your side while in a flat bed without using bedrails?: None Help needed moving from lying on your back to sitting on the side of a flat bed without using bedrails?: None Help needed moving to and from a bed to a chair (including a wheelchair)?: A Little Help needed standing up from a chair using your arms (e.g., wheelchair or bedside chair)?: A Little Help needed to walk in hospital room?: A Little Help needed climbing 3-5 steps with a railing? : A Little 6 Click Score: 20    End of Session Equipment Utilized During Treatment: Gait belt Activity Tolerance: Patient tolerated treatment well Patient left: in chair;with call bell/phone within reach;with chair alarm set Nurse Communication: Mobility status PT Visit Diagnosis: Other abnormalities of gait and mobility (R26.89);Pain;Unsteadiness on feet (R26.81) Pain - part of body: (headache)     Time: [id number] PT Time Calculation (min) (ACUTE ONLY): 23 min  Charges:  $Gait Training: 23-37 mins                     Kittie Plater, PT, DPT Acute Rehabilitation Services Pager #: [phone] Office #: [phone]    Berline Lopes 05/14/2019, 1:12 PM

## 2019-05-14 NOTE — Plan of Care (Signed)
Pt ambulated in hallway w/ PT. Pt ate 25% of meals.

## 2019-05-14 NOTE — Progress Notes (Signed)
  NEUROSURGERY PROGRESS NOTE   No issues overnight.  Persistent HA not improved with prn medication No new N/T/W  EXAM:  BP (!) 135/102   Pulse (!) 54   Temp 99.8 F (37.7 C) (Oral)   Resp (!) 21   Ht 5\' 3"  (1.6 m)   Wt 63.5 kg   SpO2 99%   BMI 24.80 kg/m   Awake, alert, oriented  Speech fluent, appropriate  CN grossly intact  5/5 BUE/BLE  No drfit  IMPRESSION/PLAN 54 y.o. female pod #8 s/p coil ACOM aneurysm. Stable neurologically. - continue supportive care, nimotop - SBP up to 180  - Hypokalemia: supplement, trend BMP

## 2019-05-14 NOTE — Progress Notes (Signed)
Transcranial Doppler  Date POD PCO2 HCT BP  MCA ACA PCA OPHT SIPH VERT Basilar  12/29,rs     Right  Left   62  54   -47  -41   44  35   21  17   21   45   -30  -34   -62      12/30,rs     Right  Left   43  56   -41  -55   34  32   20  13   65  60   -25  -37     -45    1/1 MR     Right  Left   65  48   -60  -24   23  28   15  20    50  37   38       -28    1/4 MR     Right  Left   141  104   -36  -80   25  28   16  14    36  52   -31  -30     -52          Right  Left                                            Right  Left                                            Right  Left                                        MCA = Middle Cerebral Artery      OPHT = Opthalmic Artery     BASILAR = Basilar Artery   ACA = Anterior Cerebral Artery     SIPH = Carotid Siphon PCA = Posterior Cerebral Artery   VERT = Verterbral Artery                   Normal MCA = 62+\-12 ACA = 50+\-12 PCA = 42+\-23     Abram Sander 05/14/2019 4:01 PM

## 2019-05-15 LAB — BASIC METABOLIC PANEL
Anion gap: 9 (ref 5–15)
BUN: <5 mg/dL — ABNORMAL LOW (ref 6–20)
CO2: 23 mmol/L (ref 22–32)
Calcium: 8.8 mg/dL — ABNORMAL LOW (ref 8.9–10.3)
Chloride: 108 mmol/L (ref 98–111)
Creatinine, Ser: 0.75 mg/dL (ref 0.44–1.00)
GFR calc Af Amer: >60 mL/min (ref >60)
GFR calc non Af Amer: >60 mL/min (ref >60)
Glucose, Bld: 132 mg/dL — ABNORMAL HIGH (ref 70–99)
Potassium: 3.2 mmol/L — ABNORMAL LOW (ref 3.5–5.1)
Sodium: 140 mmol/L (ref 135–145)

## 2019-05-15 MED ORDER — WHITE PETROLATUM EX OINT
TOPICAL_OINTMENT | CUTANEOUS | Status: AC
  Filled 2019-05-15: qty 28.35

## 2019-05-15 NOTE — Progress Notes (Signed)
  NEUROSURGERY PROGRESS NOTE   Pt seen and examined. No issues overnight. Cont to c/o HA.  EXAM: Temp:  [98.1 F (36.7 C)-102.1 F (38.9 C)] 98.1 F (36.7 C) (01/05 0400) Pulse Rate:  [50-66] 60 (01/05 0700) Resp:  [10-20] 19 (01/05 0700) BP: (109-152)/(73-105) 123/101 (01/05 0700) SpO2:  [96 %-100 %] 100 % (01/05 0700) Intake/Output      01/04 0701 - 01/05 0700 01/05 0701 - 01/06 0700   P.O.     I.V. (mL/kg) 2357.9 (37.1)    Total Intake(mL/kg) 2357.9 (37.1)    Net +2357.9         Urine Occurrence 9 x    Stool Occurrence 4 x     Awake, alert, oriented Speech fluent CN intact Good strength, no drift  LABS: Lab Results  Component Value Date   CREATININE 0.75 05/15/2019   BUN <5 (L) 05/15/2019   NA 140 05/15/2019   K 3.2 (L) 05/15/2019   CL 108 05/15/2019   CO2 23 05/15/2019   Lab Results  Component Value Date   WBC 8.5 05/14/2019   HGB 11.9 (L) 05/14/2019   HCT 35.7 (L) 05/14/2019   MCV 81.5 05/14/2019   PLT 189 05/14/2019    TCD reviewed, slightly increased bilateral MCA velocity, remains <150 cm/s  IMPRESSION: - 54 y.o. female [place] d# 10 s/p coiling Acom aneurysm, neurologically intact. Slightly increased TCD but clinically stable.  PLAN: - Cont current mgmt - If remains stable, likely d/c home with HHPT/OT later this week.

## 2019-05-15 NOTE — Progress Notes (Signed)
Physical Therapy Treatment Patient Details Name: Camia Schaedler MRN: [id number] DOB: Apr 03, 1966 Today's Date: 05/15/2019    History of Present Illness pt is a 54 y/o female with history of suicidal behaviors, depression, brain tumor, anxiety, admitted for [facility] due to Acom aneurysm, s/p successful coil embolization.    PT Comments    Pt admitted for above. Pt able to complete bed mobility modified-independent and transfers min guard. Trialled RW today and recommend she continue to use it during ambulation due to several episodes of unsteadiness throughout gait training. Balance difficulties exacerbated by turning head or body toward left. Pt had one episode in particular towards end of gait where she required a standing rest break. VS stable throughout ambulation, but pt noted that headache did not relent during mobility. Required cues for sequencing and walker safety during session. Equipment recommendations updated. Pt would benefit from continued skilled PT intervention to address deficits.   Vitals:  Start of session: 55 bpm, 100% SpO2, 144/87 mmHg End of session: 54 bpm, 100% SpO2, 128/77 mmHg   Follow Up Recommendations  Home health PT;Supervision/Assistance - 24 hour     Equipment Recommendations  Rolling walker with 5" wheels    Recommendations for Other Services       Precautions / Restrictions Precautions Precautions: Fall Restrictions Weight Bearing Restrictions: No    Mobility  Bed Mobility Overal bed mobility: Modified Independent             General bed mobility comments: HOB elevated, transferred self to EOB  Transfers Overall transfer level: Needs assistance Equipment used: None Transfers: Sit to/from Stand Sit to Stand: Min guard         General transfer comment: min guard for safety without AD  Ambulation/Gait Ambulation/Gait assistance: Min assist Gait Distance (Feet): 250 Feet Assistive device: Rolling walker (2 wheeled) Gait  Pattern/deviations: Step-through pattern;Drifts right/left Gait velocity: dec   General Gait Details: used RW today which is appropriate given pt's current balance; had episodes of unsteadiness several times, exacerbated by turning to L; needed assistance righting herself with gait belt; some scissoring gait noted ; she had difficulty maintaining proximity to walker; she drifted towards right throughout ambulation   Stairs             Wheelchair Mobility    Modified Rankin (Stroke Patients Only)       Balance Overall balance assessment: Needs assistance Sitting-balance support: No upper extremity supported Sitting balance-Leahy Scale: Normal Sitting balance - Comments: able to maintain EOB balance for doffing gait belt   Standing balance support: Bilateral upper extremity supported;During functional activity Standing balance-Leahy Scale: Poor Standing balance comment: requires BUE support on walker during gait                            Cognition Arousal/Alertness: Awake/alert Behavior During Therapy: WFL for tasks assessed/performed Overall Cognitive Status: Impaired/Different from baseline Area of Impairment: Problem solving                             Problem Solving: Slow processing General Comments: increased response time to some questions      Exercises      General Comments        Pertinent Vitals/Pain Pain Assessment: Faces Faces Pain Scale: Hurts worst Pain Location: head Pain Descriptors / Indicators: Headache Pain Intervention(s): Limited activity within patient's tolerance;Monitored during session;Repositioned    Home Living  Prior Function            PT Goals (current goals can now be found in the care plan section) Acute Rehab PT Goals Patient Stated Goal: get my head to stop hurting PT Goal Formulation: With patient Time For Goal Achievement: 05/24/19 Potential to Achieve Goals:  Good Progress towards PT goals: Progressing toward goals    Frequency    Min 4X/week      PT Plan Equipment recommendations need to be updated    Co-evaluation              AM-PAC PT "6 Clicks" Mobility   Outcome Measure  Help needed turning from your back to your side while in a flat bed without using bedrails?: None Help needed moving from lying on your back to sitting on the side of a flat bed without using bedrails?: None Help needed moving to and from a bed to a chair (including a wheelchair)?: A Little Help needed standing up from a chair using your arms (e.g., wheelchair or bedside chair)?: A Little Help needed to walk in hospital room?: A Little Help needed climbing 3-5 steps with a railing? : A Lot 6 Click Score: 19    End of Session Equipment Utilized During Treatment: Gait belt Activity Tolerance: Patient tolerated treatment well Patient left: in bed;with call bell/phone within reach Nurse Communication: Mobility status PT Visit Diagnosis: Other abnormalities of gait and mobility (R26.89);Pain;Unsteadiness on feet (R26.81) Pain - part of body: (head)     Time: [id number] PT Time Calculation (min) (ACUTE ONLY): 25 min  Charges:  $Gait Training: 23-37 mins                     Christel Mormon, SPT    Wyatt Thorstenson 05/15/2019, 11:48 AM

## 2019-05-15 NOTE — Plan of Care (Signed)
Pt reports she is still experiencing pain, but the PRN medications are helping relieve some of this pain.

## 2019-05-16 ENCOUNTER — Inpatient Hospital Stay (HOSPITAL_COMMUNITY): Payer: BC Managed Care – PPO

## 2019-05-16 DIAGNOSIS — I609 Nontraumatic subarachnoid hemorrhage, unspecified: Secondary | ICD-10-CM

## 2019-05-16 LAB — BASIC METABOLIC PANEL
Anion gap: 10 (ref 5–15)
BUN: 7 mg/dL (ref 6–20)
CO2: 22 mmol/L (ref 22–32)
Calcium: 9.2 mg/dL (ref 8.9–10.3)
Chloride: 104 mmol/L (ref 98–111)
Creatinine, Ser: 0.77 mg/dL (ref 0.44–1.00)
GFR calc Af Amer: >60 mL/min (ref >60)
GFR calc non Af Amer: >60 mL/min (ref >60)
Glucose, Bld: 125 mg/dL — ABNORMAL HIGH (ref 70–99)
Potassium: 3 mmol/L — ABNORMAL LOW (ref 3.5–5.1)
Sodium: 136 mmol/L (ref 135–145)

## 2019-05-16 MED ORDER — WHITE PETROLATUM EX OINT
TOPICAL_OINTMENT | CUTANEOUS | Status: AC
  Filled 2019-05-16: qty 28.35

## 2019-05-16 MED ORDER — WHITE PETROLATUM EX OINT
TOPICAL_OINTMENT | CUTANEOUS | Status: DC | PRN
  Administered 2019-05-16: 0.2 via TOPICAL

## 2019-05-16 NOTE — Progress Notes (Signed)
Transcranial Doppler  Date POD PCO2 HCT BP  MCA ACA PCA OPHT SIPH VERT Basilar  12/29,rs     Right  Left   62  54   -47  -41   44  35   21  17   21   45   -30  -34   -62      12/30,rs     Right  Left   43  56   -41  -55   34  32   20  13   65  60   -25  -37     -45    1/1 MR     Right  Left   65  48   -60  -24   23  28   15  20    50  37   38       -28    1/4 MR     Right  Left   141  104   -36  -80   25  28   16  14    36  52   -31  -30     -52    1/6 MS      Right  Left   153  76   -37  -116   21  25   20  14    60  63   -45  -63   -57           Right  Left                                            Right  Left                                        MCA = Middle Cerebral Artery      OPHT = Opthalmic Artery     BASILAR = Basilar Artery   ACA = Anterior Cerebral Artery     SIPH = Carotid Siphon PCA = Posterior Cerebral Artery   VERT = Verterbral Artery                   Normal MCA = 62+\-12 ACA = 50+\-12 PCA = 42+\-23   05/16/2019 Right Lindegaard ratio= 2.94 left Lindegaard ratio=1.29  05/16/2019 12:02 PM Maudry Mayhew, MHA, RVT, RDCS, RDMS

## 2019-05-16 NOTE — Progress Notes (Signed)
  NEUROSURGERY PROGRESS NOTE   No issues overnight.  Continues to have HA Denies N/T/W  EXAM:  BP 122/78   Pulse (!) 56   Temp 99.1 F (37.3 C) (Oral)   Resp 14   Ht 5\' 3"  (1.6 m)   Wt 63.5 kg   SpO2 95%   BMI 24.80 kg/m   Awake, alert, oriented  Speech fluent, appropriate  CN grossly intact  MAEW with symmetric strength No drift   Date POD PCO2 HCT BP  MCA ACA PCA OPHT SIPH VERT Basilar  12/29,rs     Right  Left   62  54   -47  -41   44  35   21  17   21   45   -30  -34   -62      12/30,rs     Right  Left   43  56   -41  -55   34  32   20  13   65  60   -25  -37     -45    1/1 MR     Right  Left   65  48   -60  -24   23  28   15  20    50  37   38       -28    1/4 MR     Right  Left   141  104   -36  -80   25  28   16  14    36  52   -31  -30     -52    1/6 MS      Right  Left   153  76   -37  -116   21  25   20  14    60  63   -45  -63   -89         IMPRESSION/PLAN 54 y.o. female SAH d#11 s/p coiling Acom aneurysm, neurologically intact. Slightly increased TCD but remains clinically stable. - continue close observation - nimotop, keppra, supportive care

## 2019-05-16 NOTE — Progress Notes (Signed)
Physical Therapy Treatment Patient Details Name: Carolyn Freeman MRN: [id number] DOB: [birth year] Today's Date: 05/16/2019    History of Present Illness pt is a 54 y/o female with history of suicidal behaviors, depression, brain tumor, anxiety, admitted for [facility] due to Acom aneurysm, s/p successful coil embolization.    PT Comments    Pt continues to have decreased insight to deficits and safety and remains at increased falls risk. Pt continues to refuse use of RW however pt very unsteady with L knee instability, scissoring gait and LOB with head turns. Pt REQUIRES 24/7 assist for safe d/c and use of RW.    Follow Up Recommendations  Home health PT;Supervision/Assistance - 24 hour     Equipment Recommendations  Rolling walker with 5" wheels    Recommendations for Other Services       Precautions / Restrictions Precautions Precautions: Fall Restrictions Weight Bearing Restrictions: No    Mobility  Bed Mobility Overal bed mobility: Modified Independent             General bed mobility comments: HOB elevated, transferred self to EOB  Transfers Overall transfer level: Needs assistance Equipment used: None Transfers: Sit to/from Stand Sit to Stand: Min guard         General transfer comment: min guard for safety without AD  Ambulation/Gait Ambulation/Gait assistance: Min assist Gait Distance (Feet): 250 Feet Assistive device: None Gait Pattern/deviations: WFL(Within Functional Limits);Decreased stride length;Staggering right;Staggering left Gait velocity: dec Gait velocity interpretation: <1.31 ft/sec, indicative of household ambulator General Gait Details: pt refused to amb with RW today. Trialed amb without AD however pt very unsteady and would benefit from RW as pt with frequent LOB, especially with head turns and turning in general, pt with episodes of scissoring gait pattern requring minA to regain balance and prevent fall   Stairs              Wheelchair Mobility    Modified Rankin (Stroke Patients Only) Modified Rankin (Stroke Patients Only) Pre-Morbid Rankin Score: No symptoms Modified Rankin: Moderate disability     Balance Overall balance assessment: Needs assistance Sitting-balance support: No upper extremity supported Sitting balance-Leahy Scale: Normal Sitting balance - Comments: able to maintain EOB balance for doffing gait belt   Standing balance support: Bilateral upper extremity supported;During functional activity Standing balance-Leahy Scale: Poor Standing balance comment: requires BUE support on walker during gait                            Cognition Arousal/Alertness: Awake/alert Behavior During Therapy: WFL for tasks assessed/performed Overall Cognitive Status: Impaired/Different from baseline Area of Impairment: Problem solving                             Problem Solving: Slow processing General Comments: increased response time, decreased insight to deficits and safety.       Exercises      General Comments General comments (skin integrity, edema, etc.): HR at 54bpm, inc to 126bpm during amb      Pertinent Vitals/Pain Pain Assessment: Faces Faces Pain Scale: Hurts worst Pain Location: head Pain Descriptors / Indicators: Headache Pain Intervention(s): Limited activity within patient's tolerance    Home Living                      Prior Function            PT Goals (current goals  can now be found in the care plan section) Acute Rehab PT Goals Patient Stated Goal: stop my headache, oxy doesn't work Progress towards PT goals: Progressing toward goals    Frequency    Min 4X/week      PT Plan Equipment recommendations need to be updated    Co-evaluation              AM-PAC PT "6 Clicks" Mobility   Outcome Measure  Help needed turning from your back to your side while in a flat bed without using bedrails?: None Help needed moving  from lying on your back to sitting on the side of a flat bed without using bedrails?: None Help needed moving to and from a bed to a chair (including a wheelchair)?: A Little Help needed standing up from a chair using your arms (e.g., wheelchair or bedside chair)?: A Little Help needed to walk in hospital room?: A Little Help needed climbing 3-5 steps with a railing? : A Little 6 Click Score: 20    End of Session Equipment Utilized During Treatment: Gait belt Activity Tolerance: Patient tolerated treatment well Patient left: in bed;with call bell/phone within reach Nurse Communication: Mobility status PT Visit Diagnosis: Other abnormalities of gait and mobility (R26.89);Pain;Unsteadiness on feet (R26.81)     Time: 1050-1107 PT Time Calculation (min) (ACUTE ONLY): 17 min  Charges:  $Gait Training: 8-22 mins                     Kittie Plater, PT, DPT Acute Rehabilitation Services Pager #: [phone] Office #: [phone]    Berline Lopes 05/16/2019, 1:14 PM

## 2019-05-17 LAB — BASIC METABOLIC PANEL
Anion gap: 13 (ref 5–15)
BUN: 5 mg/dL — ABNORMAL LOW (ref 6–20)
CO2: 25 mmol/L (ref 22–32)
Calcium: 9.1 mg/dL (ref 8.9–10.3)
Chloride: 100 mmol/L (ref 98–111)
Creatinine, Ser: 0.63 mg/dL (ref 0.44–1.00)
GFR calc Af Amer: >60 mL/min (ref >60)
GFR calc non Af Amer: >60 mL/min (ref >60)
Glucose, Bld: 99 mg/dL (ref 70–99)
Potassium: 3.3 mmol/L — ABNORMAL LOW (ref 3.5–5.1)
Sodium: 138 mmol/L (ref 135–145)

## 2019-05-17 MED ORDER — OXYCODONE-ACETAMINOPHEN 7.5-325 MG PO TABS: 1.0000 | ORAL_TABLET | ORAL | 0 refills | Status: AC | PRN

## 2019-05-17 MED ORDER — NIMODIPINE 30 MG PO CAPS: 60.0000 mg | ORAL_CAPSULE | ORAL | Status: AC

## 2019-05-17 MED FILL — niMODipine 30 MG CAPS: 30 | 12 days supply | Qty: 144 | Fill #0

## 2019-05-17 MED FILL — OXYCODONE-APAP 7.5-325MG: 7.5-325 | 7 days supply | Qty: 30 | Fill #0

## 2019-05-17 NOTE — Progress Notes (Signed)
Occupational Therapy Treatment Patient Details Name: Carolyn Freeman MRN: [id number] DOB: [birth year] Today's Date: 05/17/2019    History of present illness pt is a 54 y/o female with history of suicidal behaviors, depression, brain tumor, anxiety, admitted for [facility] due to Acom aneurysm, s/p successful coil embolization.   OT comments  Pt performing ADL at sink with supervisionA to minguardA for stability to standing. Pt slightly unaware of instability deficits in standing. Pt's spouse educated on [place] for mobility and initial standing balance as pt can be unsteady. Pt donning/doffing clothes with no physical assist. Pt has righting reactions for balance. Pt would benefit from continued OT skilled services for ADL, mobility and safety in [place] setting. OT signing off.     Follow Up Recommendations  Home health OT;Supervision/Assistance - 24 hour(initially)    Equipment Recommendations  3 in 1 bedside commode    Recommendations for Other Services      Precautions / Restrictions Precautions Precautions: Fall Restrictions Weight Bearing Restrictions: No       Mobility Bed Mobility Overal bed mobility: Modified Independent             General bed mobility comments: HOB elevated, transferred self to EOB  Transfers Overall transfer level: Needs assistance Equipment used: None Transfers: Sit to/from Stand Sit to Stand: Min guard         General transfer comment: min guard for safety without AD    Balance Overall balance assessment: Needs assistance Sitting-balance support: No upper extremity supported Sitting balance-Leahy Scale: Normal Sitting balance - Comments: able to maintain EOB balance for doffing gait belt   Standing balance support: Bilateral upper extremity supported;During functional activity Standing balance-Leahy Scale: Fair                             ADL either performed or assessed with clinical judgement   ADL Overall ADL's :  At baseline                                       General ADL Comments: SupervisionA for ADL tasks as pt is slightly unsteady for LB ADL tasks.     Vision       Perception     Praxis      Cognition Arousal/Alertness: Awake/alert Behavior During Therapy: WFL for tasks assessed/performed Overall Cognitive Status: Impaired/Different from baseline Area of Impairment: Problem solving                             Problem Solving: Slow processing General Comments: Pt requiring supervisionA for righting reactions        Exercises     Shoulder Instructions       General Comments VSS    Pertinent Vitals/ Pain       Pain Assessment: Faces Faces Pain Scale: Hurts a little bit Pain Location: head Pain Descriptors / Indicators: Headache Pain Intervention(s): Limited activity within patient's tolerance  Home Living                                          Prior Functioning/Environment              Frequency  Min 2X/week  Progress Toward Goals  OT Goals(current goals can now be found in the care plan section)  Progress towards OT goals: Progressing toward goals  Acute Rehab OT Goals Patient Stated Goal: stop my headache OT Goal Formulation: With patient Time For Goal Achievement: 05/27/19 Potential to Achieve Goals: Good ADL Goals Pt Will Transfer to Toilet: with modified independence;ambulating;bedside commode  Plan Discharge plan remains appropriate    Co-evaluation                 AM-PAC OT "6 Clicks" Daily Activity     Outcome Measure   Help from another person eating meals?: None Help from another person taking care of personal grooming?: None Help from another person toileting, which includes using toliet, bedpan, or urinal?: None Help from another person bathing (including washing, rinsing, drying)?: None Help from another person to put on and taking off regular upper body clothing?:  None Help from another person to put on and taking off regular lower body clothing?: None 6 Click Score: 24    End of Session Equipment Utilized During Treatment: Gait belt  OT Visit Diagnosis: Unsteadiness on feet (R26.81);Pain   Activity Tolerance Patient tolerated treatment well   Patient Left in bed;with call bell/phone within reach;with family/visitor present   Nurse Communication Mobility status        Time: 1131-1147 OT Time Calculation (min): 16 min  Charges: OT General Charges $OT Visit: 1 Visit OT Treatments $Self Care/Home Management : 8-22 mins  Jefferey Pica OTR/L Acute Rehabilitation Services Pager: [phone] Office: [phone]    Jalissa Heinzelman C 05/17/2019, 1:37 PM

## 2019-05-17 NOTE — Plan of Care (Signed)
Pt d/c to home by car with family. Assessment stable. All questions answered. 

## 2019-05-17 NOTE — TOC Transition Note (Addendum)
Transition of Care [facility]) - CM/SW Discharge Note   Patient Details  Name: Krystl Zuker MRN: [id number] Date of Birth: [birth year]  Transition of Care [facility]) CM/SW Contact:  Ella Bodo, RN Phone Number: 05/17/2019, 12:32 PM   Clinical Narrative:  Pt is a 54 y/o female with history of suicidal behaviors, depression, brain tumor, anxiety, admitted for [facility] due to Acom aneurysm, s/p successful coil embolization. PTA, pt independent, lives at home with spouse.   PT/OT recommending HH follow up, unfortunately was unable to staff HHPT/OT with current insurance.  Pt agreeable to OP follow up at [facility], and husband states he can drive her to appointments.  She does agree to [employer] for home; referral to [place] for DME needs.  Referrals made to [facility] for OP rehab follow up.   SBIRT completed; pt denies ETOH use, but admits to smoking "weed."  She states that it helps with her anxiety, and she wants to get a Rx for it.  Offered SA cessation resources, but patient states she does not want to stop, and does not plan to.        Final next level of care: OP Rehab Barriers to Discharge: Barriers Resolved                       Discharge Plan and Services   Discharge Planning Services: CM Consult            DME Arranged: Gilford Rile rolling DME Agency: [employer] Date DME Agency Contacted: 05/17/19 Time DME Agency Contacted: 30 Representative spoke with at DME Agency: [place] Interventions     Readmission Risk Interventions Readmission Risk Prevention Plan 05/17/2019  Post Dischage Appt Complete  Medication Screening Complete  Transportation Screening Complete   Reinaldo Raddle, RN, BSN  Trauma/Neuro ICU Case Manager [phone]

## 2019-05-17 NOTE — Care Management (Signed)
Per Nolene Ebbs. W/[facility]. Co-pay amount for(Nimotop 60mg ) Generic Nimodipine 30 mg.capsule 30 day supply $10.00 retail pharmacy. Mail order pharmacy  [facility]. 90 day supply $20.00.  No PA required No Deductible Tier 1 medication [facility] Pharmacy: [facility]  REF.# [id number]

## 2019-05-17 NOTE — Discharge Summary (Signed)
Physician Discharge Summary  Patient ID: Carolyn Freeman MRN: [id number] DOB/AGE: [birth year] 54 y.o.  Admit date: 05/06/2019 Discharge date: 05/17/2019  Admission Diagnoses:  [place] ACOM aneurysm   Discharge Diagnoses:  Same hypokalemia Active Problems:   Subarachnoid hemorrhage [facility])  Discharged Condition: Stable  Hospital Course:  Carolyn Freeman is a 54 y.o. female Who presented to the emergency room on 05/06/2019 after acute onset headache.  She underwent from urgent CT head followed by CTA which revealed subarachnoid hemorrhage secondary to ruptured A-comm aneurysm.   She was neurologically intact. She was admitted to the neuro ICU for further workup and management.  She was seen and evaluated by Dr. Kathyrn Sheriff later that day.  She underwent diagnostic cerebral angiogram which confirmed A-comm aneurysm.  She subsequently underwent coiling of A-comm aneurysm.  No other aneurysms, AVMs or fistulas were identified.  She had an uncomplicated hospital course otherwise with the exception of mild hypokalemia and intermittent fever, likely central from hemorrhage.  She underwent serial transcranial Dopplers.   While TCD did show slight increase in MCA velocities, she remained neurologically intact without clinical evidence of vasospasm. She was started on nimotop for 21 days, will need to complete 21 day course. She were to physical therapy and occupational therapy and it was recommended she have therapy at home after discharge.  On subarachnoid hemorrhage day 12, patient was adamant about being discharged.  I recommended staying in the hospital 1 more night due to the persistent increase in MCA velocity.  Patient was persistent and declined to stay. The risks of this were discussed at length.  Patient stated understanding and accepted the risks.  Her husband  Has taken off work for the next week or 2 to monitor at home.  Red flag symptoms were discussed at length.  She will complete her 21 day  course of nimotop.  She will return to the emergency room for any red flag symptoms.  She was discharged in hemodynamically stable condition.  Treatments: Surgery 12/27 Diagnostic cerebral angiogram, Coiling of Acom aneurysm  Discharge Exam:  Awake, alert, oriented Speech fluent, appropriate CN grossly intact 5/5 BUE/BLE Wound c/d/i  Disposition: Discharge disposition: 01-Home or Self Care       Discharge Instructions    Ambulatory referral to Occupational Therapy   Complete by: As directed    Ambulatory referral to Physical Therapy   Complete by: As directed    Call MD for:  difficulty breathing, headache or visual disturbances   Complete by: As directed    Call MD for:  difficulty breathing, headache or visual disturbances   Complete by: As directed    Call MD for:  persistant dizziness or light-headedness   Complete by: As directed    Call MD for:  persistant dizziness or light-headedness   Complete by: As directed    Call MD for:  redness, tenderness, or signs of infection (pain, swelling, redness, odor or green/yellow discharge around incision site)   Complete by: As directed    Call MD for:  redness, tenderness, or signs of infection (pain, swelling, redness, odor or green/yellow discharge around incision site)   Complete by: As directed    Call MD for:  severe uncontrolled pain   Complete by: As directed    Call MD for:  severe uncontrolled pain   Complete by: As directed    Call MD for:  temperature >100.4   Complete by: As directed    Call MD for:  temperature >100.4   Complete by:  As directed    Diet general   Complete by: As directed    Diet general   Complete by: As directed    Driving Restrictions   Complete by: As directed    Do not drive until given clearance.   Driving Restrictions   Complete by: As directed    Do not drive until given clearance.   Increase activity slowly   Complete by: As directed    Increase activity slowly   Complete by: As  directed    Lifting restrictions   Complete by: As directed    Do not lift anything >10lbs. Avoid bending and twisting in awkward positions. Avoid bending at the back.   Lifting restrictions   Complete by: As directed    Do not lift anything >10lbs. Avoid bending and twisting in awkward positions. Avoid bending at the back.   May shower / Bathe   Complete by: As directed    In 24 hours. Okay to wash wound with warm soapy water. Avoid scrubbing the wound. Pat dry.   May shower / Bathe   Complete by: As directed    In 24 hours. Okay to wash wound with warm soapy water. Avoid scrubbing the wound. Pat dry.   Remove dressing in 24 hours   Complete by: As directed    Remove dressing in 24 hours   Complete by: As directed      Allergies as of 05/17/2019   No Known Allergies     Medication List    TAKE these medications   ALPRAZolam 1 MG tablet Commonly known as: XANAX Take 1 tablet by mouth twice a day as needed for anxiety   niMODipine 30 MG capsule Commonly known as: NIMOTOP Take 2 capsules (60 mg total) by mouth every 4 (four) hours.   oxyCODONE-acetaminophen 7.5-325 MG tablet Commonly known as: Percocet Take 1 tablet by mouth every 4 (four) hours as needed for severe pain.            Durable Medical Equipment  (From admission, onward)         Start     Ordered   05/16/19 1706  DME Walker  Once    Question Answer Comment  Walker: With 5 Inch Wheels   Patient needs a walker to treat with the following condition Gait instability      05/16/19 1707         Follow-up Information    [facility].   Specialty: Rehabilitation Why: Outpatient physical and occupational therapy; rehab center will call you for an appointment, or you may call to schedule. Contact information: [address] [id number] [place]. Schedule an appointment as  soon as possible for a visit.   Why: Primary Care follow up Contact information: [place] [address] Phone:  [phone]          Signed: Traci Sermon 05/17/2019, 4:20 PM

## 2019-05-18 ENCOUNTER — Telehealth: Payer: Self-pay | Admitting: Physician Assistant

## 2019-05-18 ENCOUNTER — Other Ambulatory Visit: Payer: Self-pay | Admitting: Physician Assistant

## 2019-05-18 NOTE — Telephone Encounter (Signed)
Pt just got released from hospital yesterday after having surgery. She would like a refill on her Xanax and would like them called in at [facility] [address] [place], [place].

## 2019-05-18 NOTE — Telephone Encounter (Signed)
Left pt. A vm to return my call.

## 2019-05-18 NOTE — Telephone Encounter (Signed)
Let her know it was just filled on 04/30/2019, and taking it twice a day as needed, she shouldn't be out yet.  Not due till 05/30/19. No early RF. I can send in Hydroxyzine to help w/ anxiety if she would like.

## 2019-05-21 NOTE — Telephone Encounter (Signed)
Ok

## 2019-05-21 NOTE — Telephone Encounter (Signed)
Pt. Made aware. Does not want to try Hydroxyzine at this time.

## 2019-05-30 ENCOUNTER — Other Ambulatory Visit: Payer: Self-pay | Admitting: Physician Assistant

## 2019-05-30 NOTE — Telephone Encounter (Signed)
Apt 02/02

## 2019-06-12 ENCOUNTER — Ambulatory Visit (INDEPENDENT_AMBULATORY_CARE_PROVIDER_SITE_OTHER): Payer: BC Managed Care – PPO | Admitting: Physician Assistant

## 2019-06-12 ENCOUNTER — Encounter: Payer: Self-pay | Admitting: Physician Assistant

## 2019-06-12 DIAGNOSIS — F3162 Bipolar disorder, current episode mixed, moderate: Secondary | ICD-10-CM | POA: Diagnosis not present

## 2019-06-12 DIAGNOSIS — F429 Obsessive-compulsive disorder, unspecified: Secondary | ICD-10-CM

## 2019-06-12 DIAGNOSIS — Z9189 Other specified personal risk factors, not elsewhere classified: Secondary | ICD-10-CM

## 2019-06-12 DIAGNOSIS — R441 Visual hallucinations: Secondary | ICD-10-CM | POA: Diagnosis not present

## 2019-06-12 DIAGNOSIS — F411 Generalized anxiety disorder: Secondary | ICD-10-CM | POA: Diagnosis not present

## 2019-06-12 MED ORDER — ASENAPINE MALEATE 5 MG SL SUBL: SUBLINGUAL_TABLET | SUBLINGUAL | 0 refills | Status: DC

## 2019-06-12 NOTE — Progress Notes (Signed)
[facility]  Patient ID: Carolyn Freeman,  MRN: [id number]  PCP: Patient, No Pcp Per  Date of Evaluation: 06/12/2019 Time spent:40 minutes  Chief Complaint:  Chief Complaint    Anxiety; Depression; Follow-up     Virtual Visit via Telephone Note  I connected with patient by a video enabled telemedicine application or telephone, with their informed consent, and verified patient privacy and that I am speaking with the correct person using two identifiers.  I am private, in my office and the patient is home.  I discussed the limitations, risks, security and privacy concerns of performing an evaluation and management service by telephone and the availability of in person appointments. I also discussed with the patient that there may be a patient responsible charge related to this service. The patient expressed understanding and agreed to proceed.   I discussed the assessment and treatment plan with the patient. The patient was provided an opportunity to ask questions and all were answered. The patient agreed with the plan and demonstrated an understanding of the instructions.   The patient was advised to call back or seek an in-person evaluation if the symptoms worsen or if the condition fails to improve as anticipated.  I provided 40 minutes of non-face-to-face time during this encounter.  HISTORY/CURRENT STATUS: HPI here for medication recheck but is not doing well.  See new medical history/review of systems noted below.  Due to the fact that she has had cerebral hemorrhage in the past and now this 1, she has been told that it is likely she will have another one, which could be fatal.  That has her anxiety really high and she has been more depressed as well.  Physically, states she feels a little bit better every day.  She has had no residual neurologic deficits due to the recent subarachnoid hemorrhage.  However she does have chronic headache and she worries that every pain  she has may be a new hemorrhage.  She is having some trouble sleeping.  Anxiety is making it worse, with obsessive thoughts that are worse in the evening preventing her from going to sleep.  Usually when she is asleep, she stays asleep.  Some days she feels rested when she gets up and other days she does not.  She has been having trouble enjoying anything.  Her energy and motivation are low but that is in part due to the life-threatening illness and recent hospitalization, causing muscle weakness.  She does cry easily at times, when she thinks about all of her medical problems.  She is able to "get it together" usually.  The Xanax does help that.  She denies suicidal or homicidal thoughts.   Reports seeing "shadows" a lot lately.  This was going on before her hospitalization but is worse now.  She also hears voices that other people say they do not.  Usually it is just noise in the background where it sounds like a lot of people are talking all at once.  It is not like someone is directly talking with her.  Often times with these hallucinations whether visual or auditory, she "ignores them."  Other times, she is unable to do that.  During those times, she is more fearful and has to take a Xanax to get relief.  She denies increased energy with decreased need for sleep.  No impulsivity or risky behaviors now that she has had in the distant past.  No increased libido or spending.  No grandiosity.  Has never  been officially diagnosed with bipolar disorder but she thinks she has it.  She also thinks she has multiple personality disorder.  Patient states that because she can "swing from 1 mood to another."  She has been on many different antipsychotics with some help at times and then even if the drug has worked for a while, it quits working.  She has taken other antipsychotics that did not help at all.  She does not remember which ones were effective.  Individual Medical History/ Review of Systems: Changes?  :Yes  Had a subarachnoid hemorrhage 05/06/2019, had emergency surgery and a coil was placed.  Still has some dizziness, memory is still an issue but no paralysis.  Past medications for mental health diagnoses include: Latuda, Zyprexa, Mirtazapine, Vraylar, Lithium, Seroquel, Trazodone, Xanax, Klonopin, Gabapentin, Risperdal only took a month and unsure why d/c.  Allergies: Patient has no known allergies.  Current Medications:  Current Outpatient Medications:  .  ALPRAZolam (XANAX) 1 MG tablet, TAKE 1 TABLET BY MOUTH TWICE A DAY AS NEEDED FOR ANXIETY, Disp: 60 tablet, Rfl: 1 .  atorvastatin (LIPITOR) 40 MG tablet, Take by mouth., Disp: , Rfl:  .  asenapine (SAPHRIS) 5 MG SUBL 24 hr tablet, 1 po SL for 4 nights, then 2 SL from then on., Disp: 60 tablet, Rfl: 0 .  niMODipine (NIMOTOP) 30 MG capsule, Take 2 capsules (60 mg total) by mouth every 4 (four) hours. (Patient not taking: Reported on 06/12/2019), Disp: , Rfl:  .  oxyCODONE-acetaminophen (PERCOCET) 7.5-325 MG tablet, Take 1 tablet by mouth every 4 (four) hours as needed for severe pain. (Patient not taking: Reported on 06/12/2019), Disp: 30 tablet, Rfl: 0 Medication Side Effects: none  Family Medical/ Social History: Changes? No  MENTAL HEALTH EXAM:  There were no vitals taken for this visit.There is no height or weight on file to calculate BMI.  General Appearance: unable to assess  Eye Contact:  unable to assess  Speech:  Clear and Coherent  Volume:  Normal  Mood:  Euthymic  Affect:  unable to assess  Thought Process:  Goal Directed and Descriptions of Associations: Intact  Orientation:  Full (Time, Place, and Person)  Thought Content: Auditory and visual hallucinations described above.  Suicidal Thoughts:  No  Homicidal Thoughts:  No  Memory:  WNL  Judgement:  Good  Insight:  Good  Psychomotor Activity:  unable to assess  Concentration:  Concentration: Good  Recall:  Good  Fund of Knowledge: Good  Language: Good  Assets:   Desire for Improvement  ADL's:  Intact  Cognition: WNL  Prognosis:  Good  Discharge summary and labs from her hospitalization were reviewed.  DIAGNOSES:    ICD-10-CM   1. Moderate mixed bipolar I disorder (HCC)  F31.62   2. Visual hallucinations  R44.1   3. Generalized anxiety disorder  F41.1   4. Obsessive-compulsive disorder, unspecified type  F42.9   5. Adjustment to life threatening illness  Z91.89     Receiving Psychotherapy: No    RECOMMENDATIONS:  I spent 40 minutes with her on the phone as well as time spent reviewing the discharge summary from hospitalization 05/06/2019-05/17/2019.  Labs were also reviewed. PDMP was reviewed. She really needs to be on an antipsychotic.  She agrees and is willing to try a new one. We discussed the different options.  She has already tried several atypical antipsychotics as noted above.  She has never tried Saphris, clozapine, Geodon.  She and I discussed all the pros and  cons, risks and benefits with all of the atypical antipsychotics.  I would prefer that we try Saphris before the other 2.  She is fine with trying that.  She understands that we will need to obtain a BMP, hemoglobin A1c, and cholesterol panel at least annually.  She has had recent labs which are in the chart and reviewed today.   Start Saphris 5 mg, 1 sublingual q. evening for 4 nights and then 2 sublingual q. evening. Continue Xanax 1 mg, 1 p.o. twice daily as needed. I strongly recommend she see a Social worker. We discussed sleep hygiene. Return in 4 weeks.  Donnal Moat, PA-C

## 2019-07-10 ENCOUNTER — Ambulatory Visit: Payer: BC Managed Care – PPO | Admitting: Physician Assistant

## 2019-08-01 ENCOUNTER — Other Ambulatory Visit: Payer: Self-pay | Admitting: Physician Assistant

## 2019-08-01 NOTE — Telephone Encounter (Signed)
Last apt 02/02 due back 4 weeks not scheduled

## 2019-08-06 ENCOUNTER — Telehealth: Payer: Self-pay | Admitting: Physician Assistant

## 2019-08-06 ENCOUNTER — Other Ambulatory Visit: Payer: Self-pay

## 2019-08-06 MED ORDER — ASENAPINE MALEATE 5 MG SL SUBL: SUBLINGUAL_TABLET | SUBLINGUAL | 0 refills | Status: DC

## 2019-08-06 MED ORDER — ALPRAZOLAM 1 MG PO TABS: ORAL_TABLET | ORAL | 0 refills | Status: DC

## 2019-08-06 NOTE — Telephone Encounter (Signed)
Pt needs refills on Medications. Pt has made appt for 08/20/19. Can pt get meds to hold her over until appt? Please send to pharmacy on file.

## 2019-08-06 NOTE — Telephone Encounter (Signed)
Last refill 07/03/2019 Pended for Helene Kelp to submit

## 2019-08-20 ENCOUNTER — Encounter: Payer: Self-pay | Admitting: Physician Assistant

## 2019-08-20 ENCOUNTER — Ambulatory Visit (INDEPENDENT_AMBULATORY_CARE_PROVIDER_SITE_OTHER): Payer: BC Managed Care – PPO | Admitting: Physician Assistant

## 2019-08-20 DIAGNOSIS — F411 Generalized anxiety disorder: Secondary | ICD-10-CM | POA: Diagnosis not present

## 2019-08-20 DIAGNOSIS — F319 Bipolar disorder, unspecified: Secondary | ICD-10-CM

## 2019-08-20 DIAGNOSIS — R441 Visual hallucinations: Secondary | ICD-10-CM

## 2019-08-20 DIAGNOSIS — F22 Delusional disorders: Secondary | ICD-10-CM | POA: Diagnosis not present

## 2019-08-20 MED ORDER — LURASIDONE HCL 40 MG PO TABS: 40.0000 mg | ORAL_TABLET | Freq: Every day | ORAL | 1 refills | Status: DC

## 2019-08-20 MED ORDER — ALPRAZOLAM 1 MG PO TABS: ORAL_TABLET | ORAL | 2 refills | Status: DC

## 2019-08-20 NOTE — Progress Notes (Signed)
[facility]  Patient ID: Carolyn Freeman,  MRN: [id number]  PCP: Patient, No Pcp Per  Date of Evaluation: 08/20/2019 Time spent:30 minutes  Chief Complaint:  Chief Complaint    Anxiety; Depression     Virtual Visit via Telephone Note  I connected with patient by a video enabled telemedicine application or telephone, with their informed consent, and verified patient privacy and that I am speaking with the correct person using two identifiers.  I am private, in my office and the patient is home.  I discussed the limitations, risks, security and privacy concerns of performing an evaluation and management service by telephone and the availability of in person appointments. I also discussed with the patient that there may be a patient responsible charge related to this service. The patient expressed understanding and agreed to proceed.   I discussed the assessment and treatment plan with the patient. The patient was provided an opportunity to ask questions and all were answered. The patient agreed with the plan and demonstrated an understanding of the instructions.   The patient was advised to call back or seek an in-person evaluation if the symptoms worsen or if the condition fails to improve as anticipated.  I provided 30 minutes of non-face-to-face time during this encounter.  HISTORY/CURRENT STATUS: HPI For routine med check.  Two months ago, we added Saphris.  Doesn't like the way it makes her feel.  She gets jittery after she takes it.  Also it causes her to throw up sometimes.  That causes concern because she has had cerebral aneurysm in the past and it worries her that vomiting and then the headache that follows the vomiting with the Saphris, it is something other than a side effect from the [place].  It did not start until we began that medicine and only occurs after she takes it.  No other times of the day.  Continues to have visual hallucinations, where she sees  shadows of people standing nearby.  That makes her really uneasy.  She never can see who the people are.  Sometimes they move around and sometimes they do not.  She will also see shadows of an animal skittering by.  She sometimes feels that she is being watched.  She is still having trouble enjoying anything.  Energy and motivation are low.  Cries easily sometimes.  No suicidal or homicidal thoughts.  Anxiety can still be a problem especially when she senses something watching her more when she sees shadows of people who were not there.  The Xanax does help.  Patient denies increased energy with decreased need for sleep, no increased talkativeness, no racing thoughts, no impulsivity or risky behaviors, no increased spending, no increased libido, no grandiosity, no increased irritability or anger, and no hallucinations.  Denies dizziness, syncope, seizures, numbness, tingling, tremor, tics, unsteady gait, slurred speech, confusion. Denies muscle or joint pain, stiffness, or dystonia.Denies unexplained weight loss, frequent infections, or sores that heal slowly.  No polyphagia, polydipsia, or polyuria. Denies visual changes or paresthesias.   Individual Medical History/ Review of Systems: Changes? :No    Past medications for mental health diagnoses include: Anette Guarneri was too expensive, Zyprexa, Mirtazapine, Vraylar, Lithium, Seroquel caused sedation and wt gain, Trazodone, Xanax, Klonopin, Gabapentin, Risperdal only took a month and unsure why d/c.  Allergies: Patient has no known allergies.  Current Medications:  Current Outpatient Medications:  .  ALPRAZolam (XANAX) 1 MG tablet, Take 1 tablet by mouth twice a day as needed, Disp: 60 tablet,  Rfl: 2 .  atorvastatin (LIPITOR) 40 MG tablet, Take by mouth., Disp: , Rfl:  .  lisinopril (ZESTRIL) 10 MG tablet, Take 10 mg by mouth daily., Disp: , Rfl:  .  metFORMIN (GLUCOPHAGE) 500 MG tablet, Take by mouth., Disp: , Rfl:  .  lurasidone (LATUDA) 40 MG TABS  tablet, Take 1 tablet (40 mg total) by mouth daily with supper., Disp: 30 tablet, Rfl: 1 .  niMODipine (NIMOTOP) 30 MG capsule, Take 2 capsules (60 mg total) by mouth every 4 (four) hours. (Patient not taking: Reported on 06/12/2019), Disp: , Rfl:  .  oxyCODONE-acetaminophen (PERCOCET) 7.5-325 MG tablet, Take 1 tablet by mouth every 4 (four) hours as needed for severe pain. (Patient not taking: Reported on 06/12/2019), Disp: 30 tablet, Rfl: 0 Medication Side Effects: nausea  Family Medical/ Social History: Changes? No  MENTAL HEALTH EXAM:  There were no vitals taken for this visit.There is no height or weight on file to calculate BMI.  General Appearance: Unable to assess  Eye Contact:  Unable to assess  Speech:  Clear and Coherent and Normal Rate  Volume:  Normal  Mood:  Euthymic  Affect:  Unable to assess  Thought Process:  Goal Directed and Descriptions of Associations: Intact  Orientation:  Full (Time, Place, and Person)  Thought Content: Hallucinations: Visual and Paranoid Ideation   Suicidal Thoughts:  No  Homicidal Thoughts:  No  Memory:  WNL  Judgement:  Good  Insight:  Good  Psychomotor Activity:  Unable to assess  Concentration:  Concentration: Good  Recall:  Good  Fund of Knowledge: Good  Language: Good  Assets:  Desire for Improvement  ADL's:  Intact  Cognition: WNL  Prognosis:  Good    DIAGNOSES:    ICD-10-CM   1. Visual hallucinations  R44.1   2. Paranoia ([place])  F22   3. Bipolar I disorder ([place])  F31.9   4. Generalized anxiety disorder  F41.1     Receiving Psychotherapy: No    RECOMMENDATIONS:  PDMP was reviewed. I spent 30 minutes with her. We discussed the different options of antipsychotics.  We discussed the different ones that she has tried and what she has not including Geodon and clozapine.  Patient prefers not to take something that will cause weight gain, stating that will make her more depressed.  She took [place] in the past which was helpful but  was pricey at the time.  I explained that Geodon and clozapine will likely cause weight gain.  And with the clozapine she will have to get weekly CBCs for 6 months then every other week for 6 months and then monthly forever as long as she takes it.  She prefers not to go that route.  She is willing to retry the [place].  I explained this will help with the paranoia and hallucinations as well as the depression.  She understands that we will need to watch her glucose as well as cholesterol while on any drug in this class.  We will obtain labs in a few months after being on this drug. Discontinue Saphris. Start Latuda 40 mg, 1 p.o. q. evening with food.  Importance of that stressed.  She will pick up 2 weeks worth of samples, along with a co-pay card. Continue Xanax 1 mg, 1 p.o. twice daily as needed. Consider getting back into counseling. Return in 4 to 6 weeks.  Donnal Moat, PA-C

## 2019-08-30 ENCOUNTER — Other Ambulatory Visit: Payer: Self-pay | Admitting: Physician Assistant

## 2019-10-02 ENCOUNTER — Other Ambulatory Visit: Payer: Self-pay | Admitting: Physician Assistant

## 2019-10-02 ENCOUNTER — Ambulatory Visit: Payer: BC Managed Care – PPO | Admitting: Physician Assistant

## 2019-10-25 ENCOUNTER — Telehealth: Payer: BC Managed Care – PPO | Admitting: Physician Assistant

## 2019-10-25 ENCOUNTER — Ambulatory Visit: Payer: BC Managed Care – PPO | Admitting: Physician Assistant

## 2019-12-08 ENCOUNTER — Other Ambulatory Visit: Payer: Self-pay | Admitting: Physician Assistant

## 2019-12-10 NOTE — Telephone Encounter (Signed)
Last visit 06-2019.

## 2019-12-13 ENCOUNTER — Ambulatory Visit (INDEPENDENT_AMBULATORY_CARE_PROVIDER_SITE_OTHER): Payer: BC Managed Care – PPO | Admitting: Physician Assistant

## 2019-12-13 ENCOUNTER — Encounter: Payer: Self-pay | Admitting: Physician Assistant

## 2019-12-13 ENCOUNTER — Other Ambulatory Visit: Payer: Self-pay

## 2019-12-13 DIAGNOSIS — F332 Major depressive disorder, recurrent severe without psychotic features: Secondary | ICD-10-CM

## 2019-12-13 DIAGNOSIS — F411 Generalized anxiety disorder: Secondary | ICD-10-CM

## 2019-12-13 MED ORDER — ALPRAZOLAM 1 MG PO TABS: 1.0000 mg | ORAL_TABLET | Freq: Two times a day (BID) | ORAL | 1 refills | Status: AC | PRN

## 2019-12-13 MED ORDER — FLUOXETINE HCL 20 MG PO CAPS: 20.0000 mg | ORAL_CAPSULE | Freq: Every morning | ORAL | 1 refills | Status: AC

## 2019-12-13 NOTE — Progress Notes (Signed)
[facility]  Patient ID: Carolyn Freeman,  MRN: [id number]  PCP: Patient, No Pcp Per  Date of Evaluation: 12/13/2019 Time spent:30 minutes  Chief Complaint:  Chief Complaint    Depression; Anxiety      HISTORY/CURRENT STATUS: HPI For routine med check.  Since her last visit in April, she had a carotid aneurysm repair. Her family treats her like 'glass.' Like she's going to go any minute.  It gets old. But she knows it could be true.   States she's totally depressed. "I've got a lot to be depressed about. I've had 3 brain surgeries since Dec.." Says she feels down all the time.  Does not even want to get out of bed.  Does not really enjoy things at all.  Motivation is very low.  Cries sometimes for no reason. She d/c Latuda.  Too expensive and didn't help.  Denies suicidal or homicidal thoughts.  Anxiety but can be crippling at times.  She went on a recent road trip to [place] with one of her sisters and she had anxiety so bad that the trip took almost twice as long as it should have because of panic attacks.  Patient reports seeing shadows at times.  And she will hear something in her head telling her what to do, but it is her own voice.  Patient denies increased energy with decreased need for sleep, no increased talkativeness, no racing thoughts, no impulsivity or risky behaviors, no increased spending, no increased libido, no grandiosity, no increased irritability or anger, no paranoia.  Denies dizziness, syncope, seizures, numbness, tingling, tremor, tics, unsteady gait, slurred speech, confusion. Denies muscle or joint pain, stiffness, or dystonia.Denies unexplained weight loss, frequent infections, or sores that heal slowly.  No polyphagia, polydipsia, or polyuria. Denies visual changes or paresthesias.   Individual Medical History/ Review of Systems: Changes? :No    Past medications for mental health diagnoses include: Anette Guarneri was too expensive, Zyprexa, Mirtazapine,  Vraylar, Lithium, Seroquel caused sedation and wt gain, Trazodone, Xanax, Klonopin, Gabapentin, Saphris, Risperdal only took a month and unsure why d/c.  Allergies: Patient has no known allergies.  Current Medications:  Current Outpatient Medications:  .  ALPRAZolam (XANAX) 1 MG tablet, Take 1 tablet (1 mg total) by mouth 2 (two) times daily as needed., Disp: 60 tablet, Rfl: 1 .  atorvastatin (LIPITOR) 40 MG tablet, Take by mouth., Disp: , Rfl:  .  lisinopril (ZESTRIL) 10 MG tablet, Take 10 mg by mouth daily., Disp: , Rfl:  .  metFORMIN (GLUCOPHAGE) 500 MG tablet, Take by mouth., Disp: , Rfl:  .  FLUoxetine (PROZAC) 20 MG capsule, Take 1 capsule (20 mg total) by mouth in the morning., Disp: 30 capsule, Rfl: 1 .  niMODipine (NIMOTOP) 30 MG capsule, Take 2 capsules (60 mg total) by mouth every 4 (four) hours. (Patient not taking: Reported on 06/12/2019), Disp: , Rfl:  .  oxyCODONE-acetaminophen (PERCOCET) 7.5-325 MG tablet, Take 1 tablet by mouth every 4 (four) hours as needed for severe pain. (Patient not taking: Reported on 06/12/2019), Disp: 30 tablet, Rfl: 0 Medication Side Effects: nausea  Family Medical/ Social History: Changes? No  MENTAL HEALTH EXAM:  There were no vitals taken for this visit.There is no height or weight on file to calculate BMI.  General Appearance: Casual, Neat and Well Groomed  Eye Contact:  Good  Speech:  Clear and Coherent and Normal Rate  Volume:  Normal  Mood:  Depressed  Affect:  Depressed  Thought Process:  Goal  Directed and Descriptions of Associations: Intact  Orientation:  Full (Time, Place, and Person)  Thought Content: sees shadows.   Suicidal Thoughts:  No  Homicidal Thoughts:  No  Memory:  WNL  Judgement:  Good  Insight:  Good  Psychomotor Activity:  Normal  Concentration:  Concentration: Good  Recall:  Good  Fund of Knowledge: Good  Language: Good  Assets:  Desire for Improvement  ADL's:  Intact  Cognition: WNL  Prognosis:  Good     DIAGNOSES:    ICD-10-CM   1. Severe episode of recurrent major depressive disorder, without psychotic features ([employer])  F33.2   2. Generalized anxiety disorder  F41.1     Receiving Psychotherapy: No    RECOMMENDATIONS:  PDMP was reviewed. I provided 30 mins of face to face time during this encounter.  Discussed options to help w/ depression and anxiety. Recommend SSRI.  I don't feel like and AP is necessary at this time.  She's very concerned about weight gain. She agreed to try Prozac. It is not as common to cause increased hunger and weight as some of the other SSRIs. Benefits, risks, SE were discussed and she accepts. Reviewed ER notes, carotid aneurysm see on chart. D/C Latuda, which she already has.  Start Prozac 20 mg, 1 po q am. Continue Xanax 1 mg, 1 p.o. twice daily as needed. Consider getting back into counseling. Return in 4 weeks.  Donnal Moat, PA-C

## 2020-01-02 ENCOUNTER — Other Ambulatory Visit: Payer: Self-pay | Admitting: Physician Assistant

## 2020-01-10 ENCOUNTER — Telehealth: Payer: Self-pay | Admitting: Physician Assistant

## 2020-04-17 ENCOUNTER — Other Ambulatory Visit: Payer: Self-pay | Admitting: Physician Assistant

## 2020-04-21 ENCOUNTER — Telehealth: Payer: Self-pay | Admitting: Physician Assistant

## 2020-04-21 NOTE — Telephone Encounter (Signed)
Prescription was already denied through her pharmacy.  She must be seen for more.

## 2020-04-21 NOTE — Telephone Encounter (Signed)
Patient's last visit was 01/10/20. She doesn't have another visit scheduled. She has a $100 bad debt she needs to pay before being seen again. She said someone from here told her this. She said she cannot pay the bad debt off. She is requesting a refill on her Xanax called to [facility] in [place], [place]. Store # [phone]. Phone # is [phone].

## 2020-06-23 IMAGING — CT CT HEAD W/O CM
4 series · 16 of 47 positions shown, 18 images · non-contrast
Comparison: Head CT dated 05/06/2019.

CLINICAL DATA: 53-year-old female with left facial droop. Recent
coil embolization of ruptured ACA aneurysm.

EXAM:
CT HEAD WITHOUT CONTRAST
TECHNIQUE: Contiguous axial images were obtained from the base of the skull
through the vertex without intravenous contrast.

[Series 3: head (person_name) · axial · 0.45mm/px · z∈[+1100,+1220]mm · 7 of 32 slices shown, 9 images]
[im 4/32  brain]
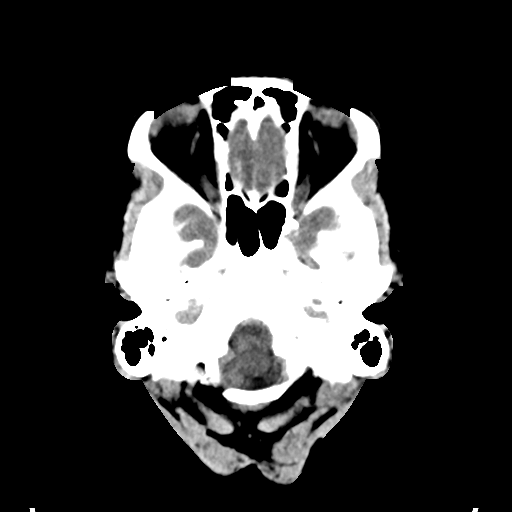
[im 4/32  bone]
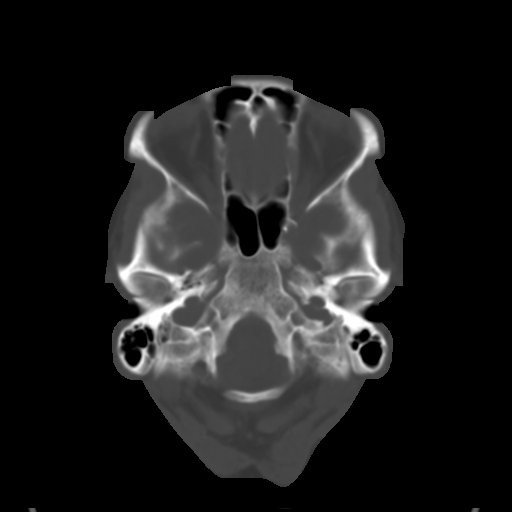
[im 8/32  brain]
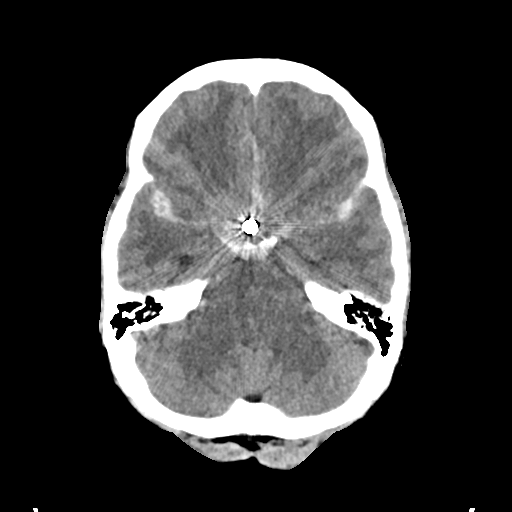
[im 12/32  brain]
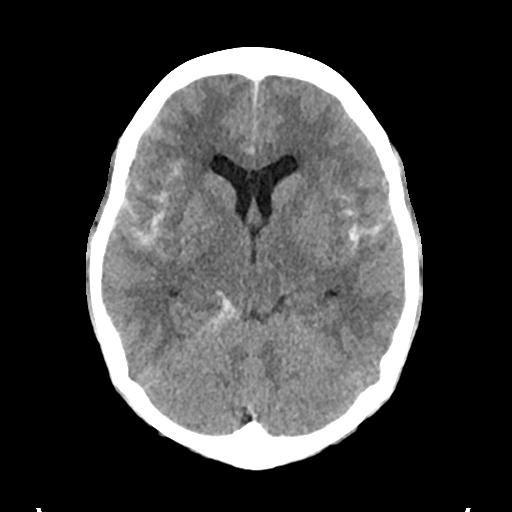
[im 16/32  brain]
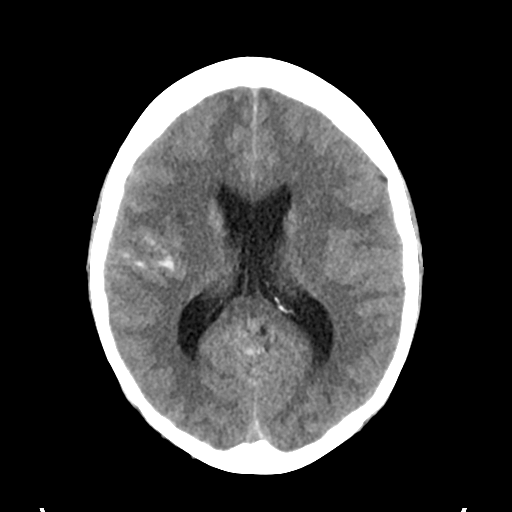
[im 20/32  brain]
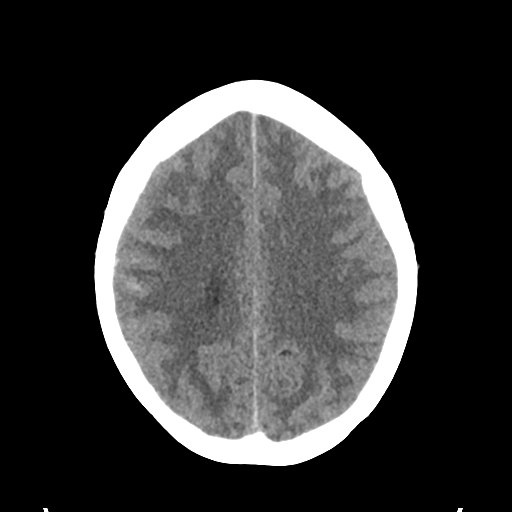
[im 20/32  bone]
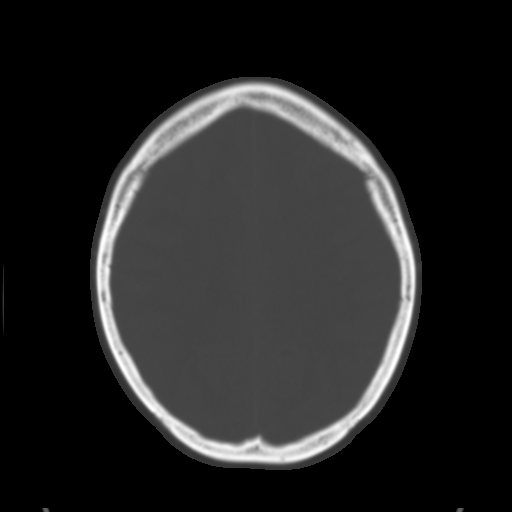
[im 24/32  brain]
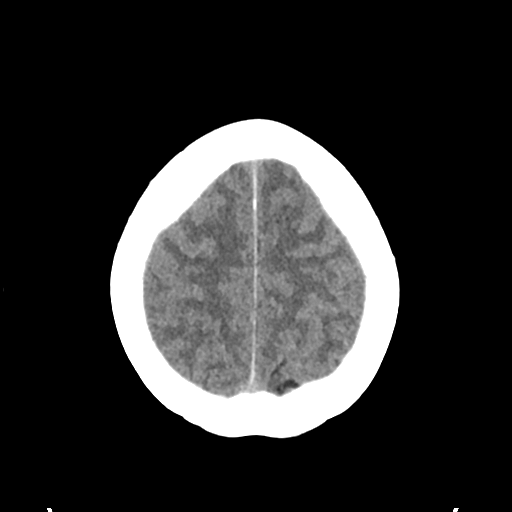
[im 28/32  brain]
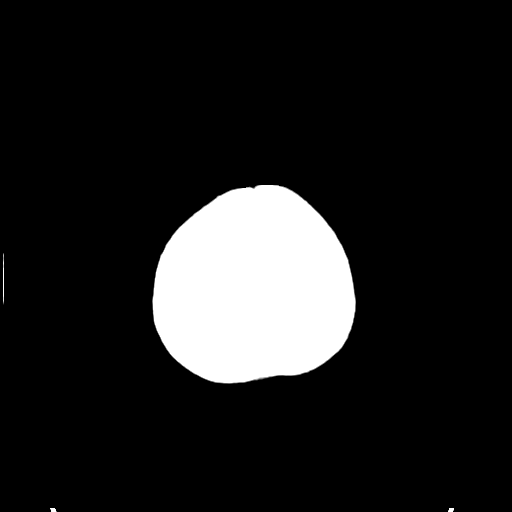

[Series 4: head bone (person_name) · axial · 0.45mm/px · z∈[+1099,+1131]mm · 3 of 78 slices shown]
[im 8/78  bone]
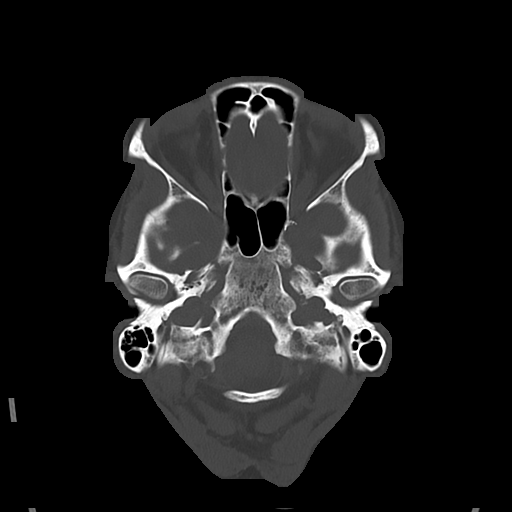
[im 16/78  bone]
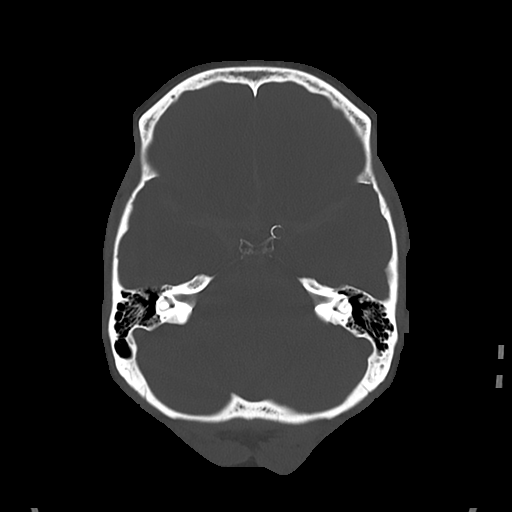
[im 24/78  bone]
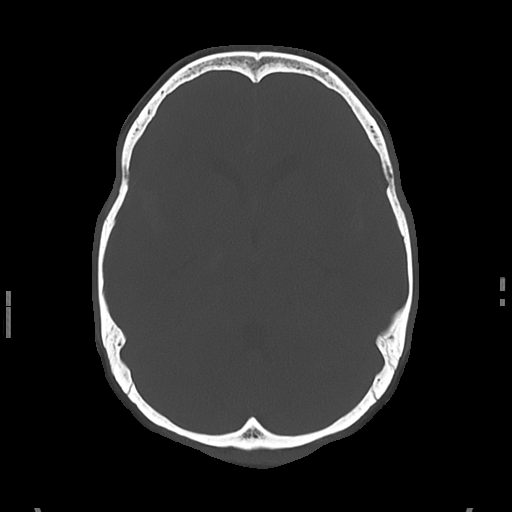

[Series 5: cor soft (person_name) · coronal · 0.32mm/px · 3 of 72 slices shown]
[im 24/72  brain]
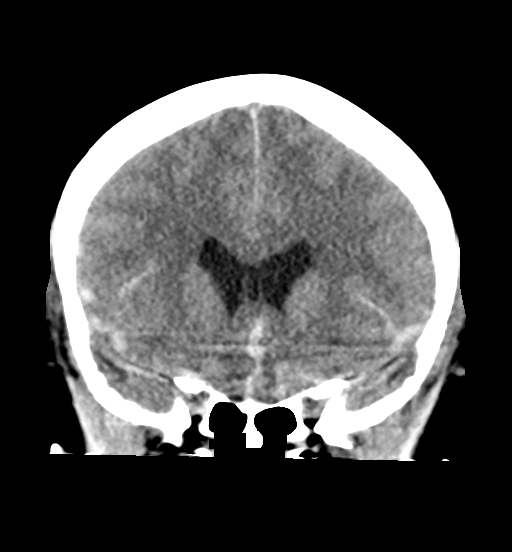
[im 32/72  brain]
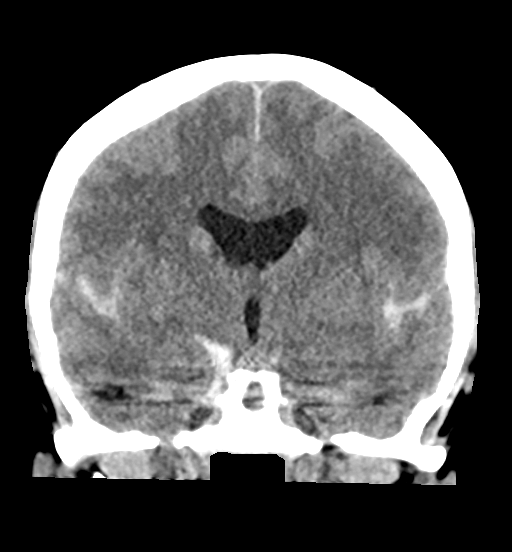
[im 40/72  brain]
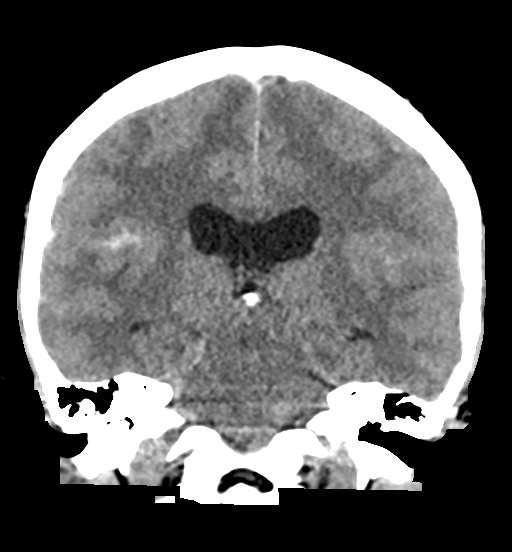

[Series 6: sag soft (person_name) · sagittal · 0.31mm/px · 3 of 56 slices shown]
[im 19/56  brain]
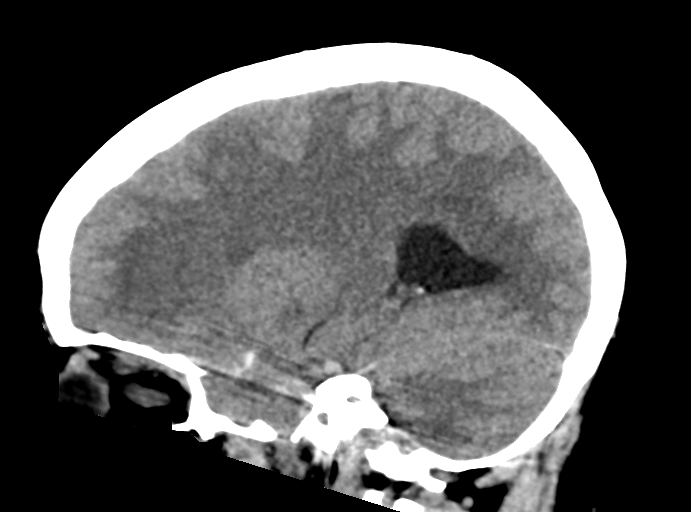
[im 28/56  brain]
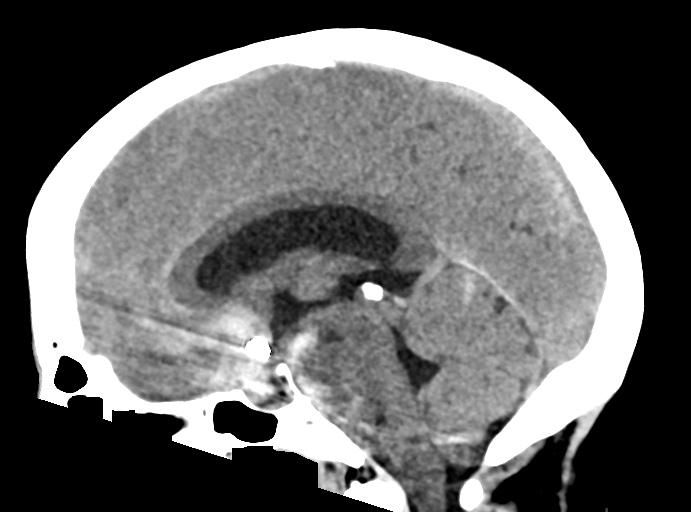
[im 37/56  brain]
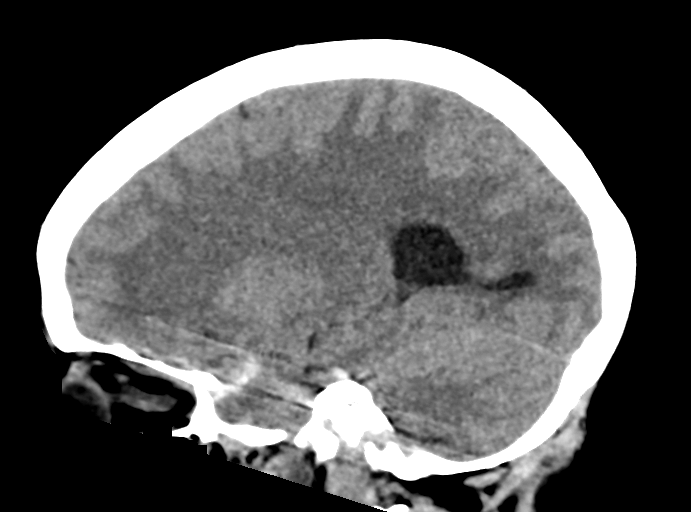

[16 of 47 positions shown; findings below may reference images not displayed]

FINDINGS: Brain: Bilateral subarachnoid hemorrhages mostly within the sylvian
fissures and in the basilar cistern. There is extension of the blood
along the anterior interhemispheric fissure. Overall similar or
slightly decreased in the size of the intracranial hemorrhage since
the prior CT. No intraventricular hemorrhage. No midline shift. The
gray white matter discrimination is preserved.

Vascular: Anterior communicating artery aneurysm coil embolization.
Left ICA stent.

Skull: Normal. Negative for fracture or focal lesion.

Sinuses/Orbits: No acute finding.

Other: None
IMPRESSION: 1. Bilateral subarachnoid hemorrhages, similar or minimally
decreased in the size since the prior CT. No intraventricular
hemorrhage. No midline shift. Continued close follow-up recommended.
2. Anterior communicating artery aneurysm coil embolization.

## 2021-03-09 ENCOUNTER — Other Ambulatory Visit: Payer: Self-pay | Admitting: Physician Assistant

## 2021-04-16 ENCOUNTER — Ambulatory Visit: Payer: Self-pay | Admitting: Physician Assistant

## 2023-08-30 ENCOUNTER — Other Ambulatory Visit: Payer: Self-pay

## 2023-08-30 DIAGNOSIS — I671 Cerebral aneurysm, nonruptured: Secondary | ICD-10-CM

## 2023-08-30 DIAGNOSIS — R519 Headache, unspecified: Secondary | ICD-10-CM

## 2023-09-19 ENCOUNTER — Ambulatory Visit
Admission: RE | Admit: 2023-09-19 | Discharge: 2023-09-19 | Disposition: A | Discharge status: Home / Self Care | Payer: Self-pay | Source: Ambulatory Visit

## 2023-09-19 DIAGNOSIS — I671 Cerebral aneurysm, nonruptured: Secondary | ICD-10-CM

## 2023-09-19 DIAGNOSIS — R519 Headache, unspecified: Secondary | ICD-10-CM
